# Patient Record
Sex: Female | Born: 1970 | Race: White | Hispanic: No | Marital: Single | Smoking: Current every day smoker
Health system: Southern US, Community
[De-identification: ages and names within clinical notes are randomized; demographics above are authoritative.]

## PROBLEM LIST (undated history)

## (undated) DIAGNOSIS — F329 Major depressive disorder, single episode, unspecified: Secondary | ICD-10-CM

## (undated) DIAGNOSIS — F32A Depression, unspecified: Secondary | ICD-10-CM

## (undated) DIAGNOSIS — M1990 Unspecified osteoarthritis, unspecified site: Secondary | ICD-10-CM

## (undated) DIAGNOSIS — G709 Myoneural disorder, unspecified: Secondary | ICD-10-CM

## (undated) DIAGNOSIS — F419 Anxiety disorder, unspecified: Secondary | ICD-10-CM

## (undated) DIAGNOSIS — K219 Gastro-esophageal reflux disease without esophagitis: Secondary | ICD-10-CM

## (undated) DIAGNOSIS — J45909 Unspecified asthma, uncomplicated: Secondary | ICD-10-CM

## (undated) DIAGNOSIS — I10 Essential (primary) hypertension: Secondary | ICD-10-CM

## (undated) HISTORY — PX: TONSILLECTOMY: SUR1361

## (undated) HISTORY — PX: OTHER SURGICAL HISTORY: SHX169

## (undated) HISTORY — PX: TYMPANOSTOMY TUBE PLACEMENT: SHX32

---

## 2003-10-19 ENCOUNTER — Ambulatory Visit (HOSPITAL_COMMUNITY): Admission: RE | Admit: 2003-10-19 | Discharge: 2003-10-19 | Payer: Self-pay | Admitting: *Deleted

## 2004-01-09 ENCOUNTER — Inpatient Hospital Stay (HOSPITAL_COMMUNITY): Admission: AD | Admit: 2004-01-09 | Discharge: 2004-01-13 | Payer: Self-pay | Admitting: Obstetrics and Gynecology

## 2004-01-09 ENCOUNTER — Ambulatory Visit: Payer: Self-pay | Admitting: *Deleted

## 2005-02-04 ENCOUNTER — Emergency Department (HOSPITAL_COMMUNITY): Admission: EM | Admit: 2005-02-04 | Discharge: 2005-02-04 | Payer: Self-pay | Admitting: Family Medicine

## 2007-02-23 ENCOUNTER — Emergency Department (HOSPITAL_COMMUNITY): Admission: EM | Admit: 2007-02-23 | Discharge: 2007-02-23 | Payer: Self-pay | Admitting: Emergency Medicine

## 2008-07-20 ENCOUNTER — Ambulatory Visit (HOSPITAL_COMMUNITY): Admission: RE | Admit: 2008-07-20 | Discharge: 2008-07-20 | Payer: Self-pay | Admitting: Family Medicine

## 2008-08-30 ENCOUNTER — Ambulatory Visit (HOSPITAL_COMMUNITY): Admission: RE | Admit: 2008-08-30 | Discharge: 2008-08-30 | Payer: Self-pay | Admitting: Obstetrics & Gynecology

## 2008-10-11 ENCOUNTER — Ambulatory Visit (HOSPITAL_COMMUNITY): Admission: RE | Admit: 2008-10-11 | Discharge: 2008-10-11 | Payer: Self-pay | Admitting: Obstetrics & Gynecology

## 2008-11-03 ENCOUNTER — Inpatient Hospital Stay (HOSPITAL_COMMUNITY): Admission: AD | Admit: 2008-11-03 | Discharge: 2008-11-03 | Payer: Self-pay | Admitting: Family Medicine

## 2008-12-27 ENCOUNTER — Ambulatory Visit (HOSPITAL_COMMUNITY): Admission: RE | Admit: 2008-12-27 | Discharge: 2008-12-27 | Payer: Self-pay | Admitting: Family Medicine

## 2009-01-01 ENCOUNTER — Ambulatory Visit: Payer: Self-pay | Admitting: Obstetrics and Gynecology

## 2009-01-01 ENCOUNTER — Inpatient Hospital Stay (HOSPITAL_COMMUNITY): Admission: AD | Admit: 2009-01-01 | Discharge: 2009-01-03 | Payer: Self-pay | Admitting: Obstetrics & Gynecology

## 2009-07-01 IMAGING — US US OB DETAIL+14 WK
1 series · 18 of 28 positions shown · non-contrast
Comparison: none

OBSTETRICAL ULTRASOUND:
 This ultrasound was performed in The [HOSPITAL], and the [facility] report will be stored to [REDACTED] PACS.

[Series 1: us ob detail+14 wk · 18 of 96 slices shown]
[im 1/96]
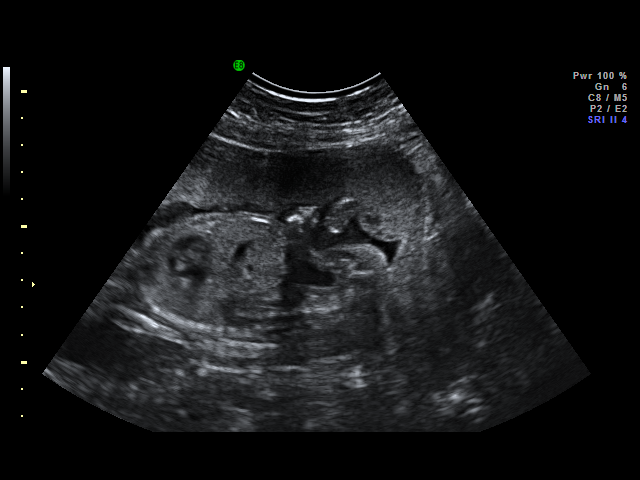
[im 8/96]
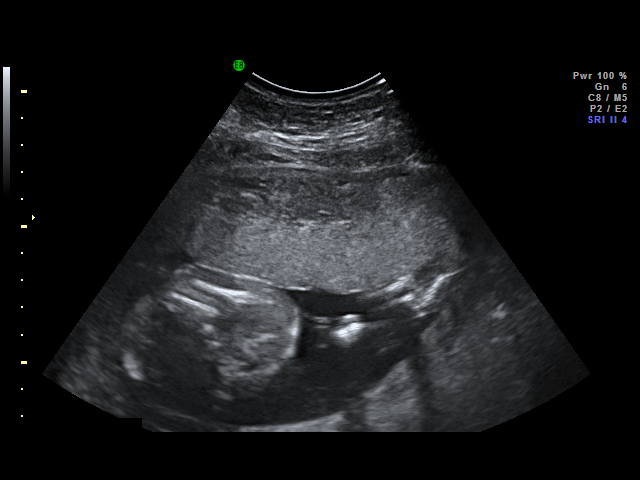
[im 11/96]
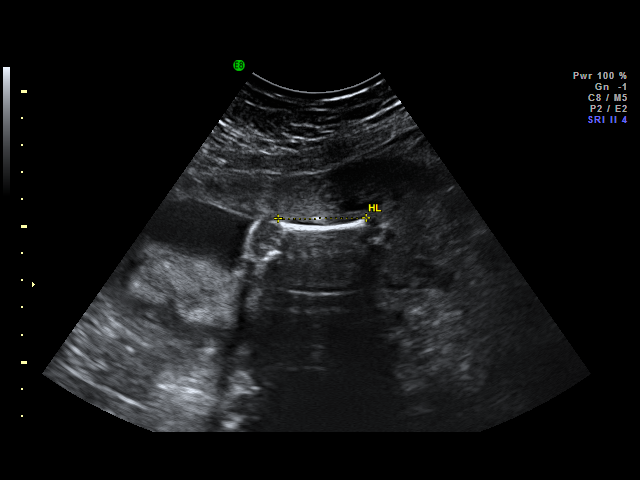
[im 18/96]
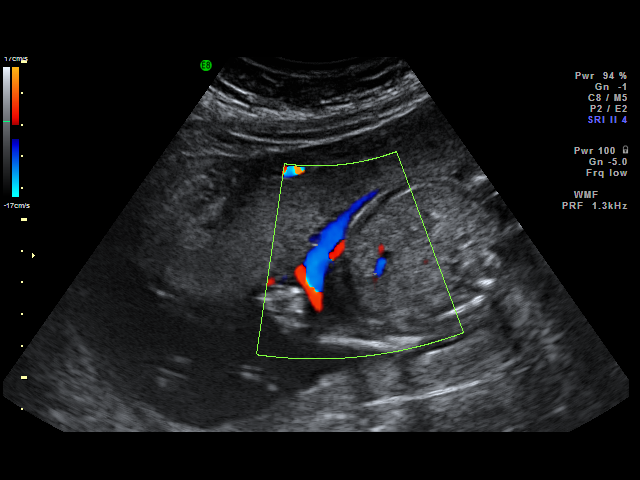
[im 25/96]
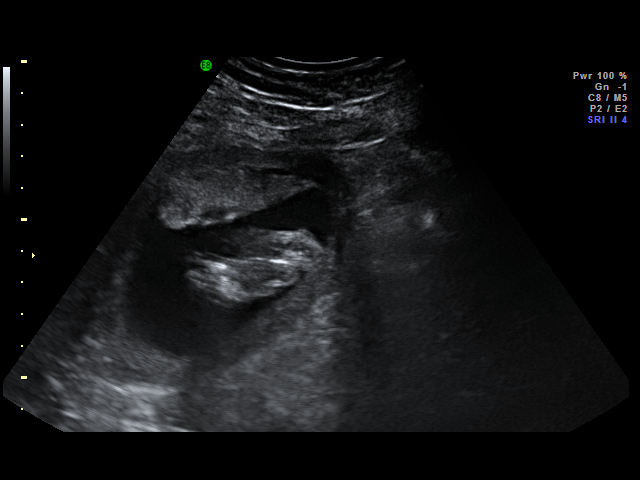
[im 29/96]
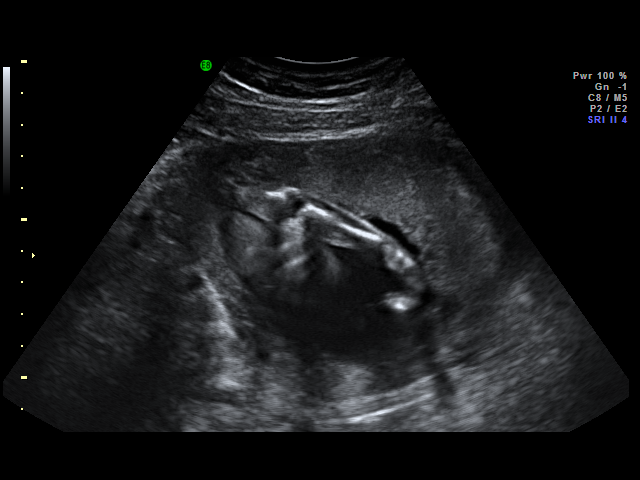
[im 36/96]
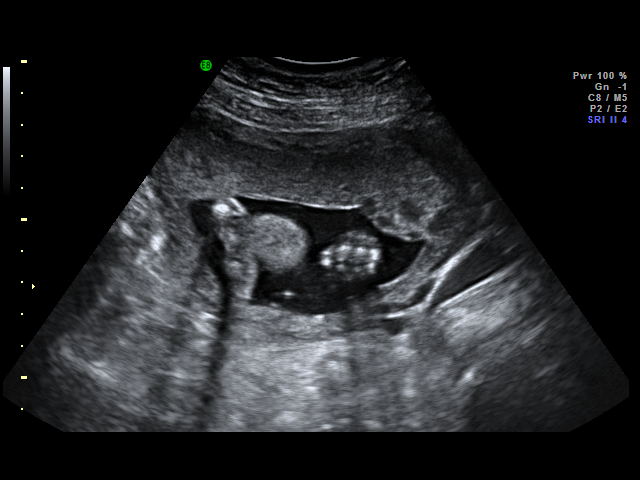
[im 39/96]
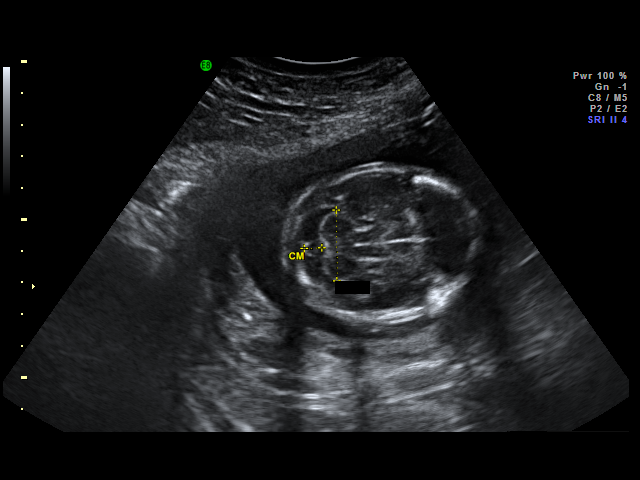
[im 46/96]
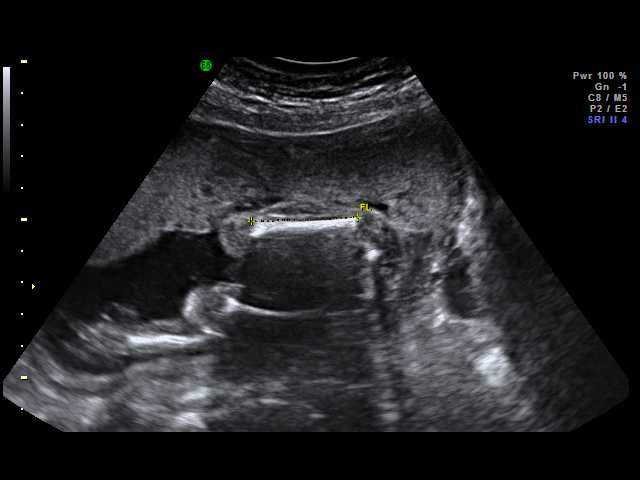
[im 50/96]
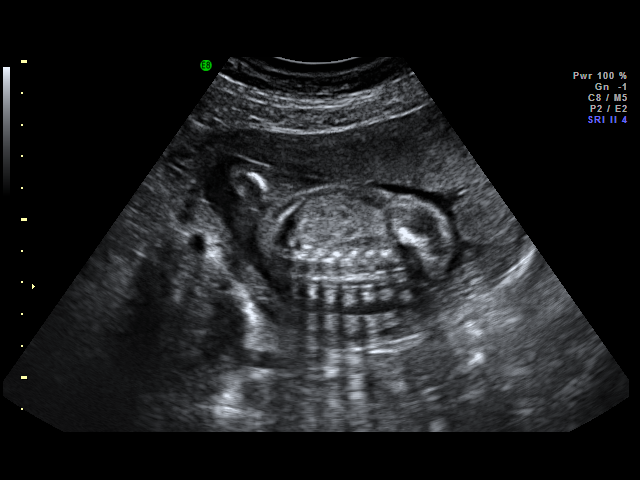
[im 57/96]
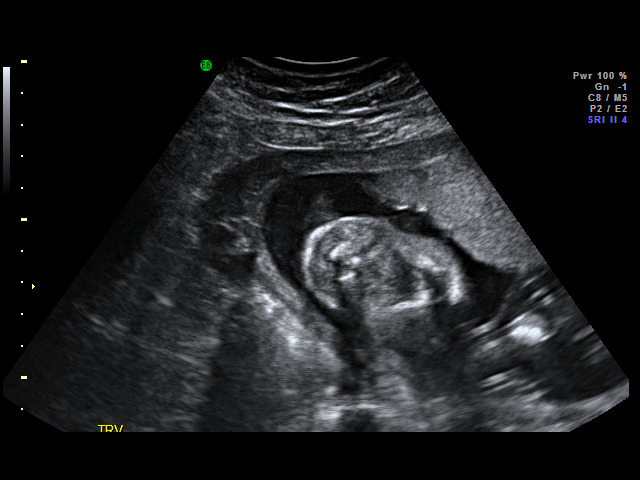
[im 60/96]
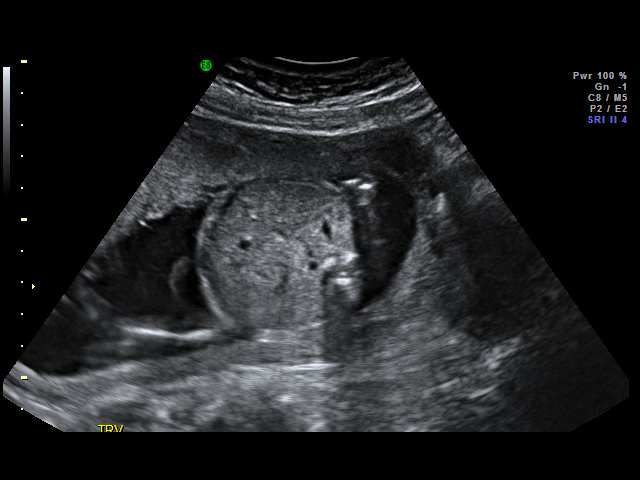
[im 67/96]
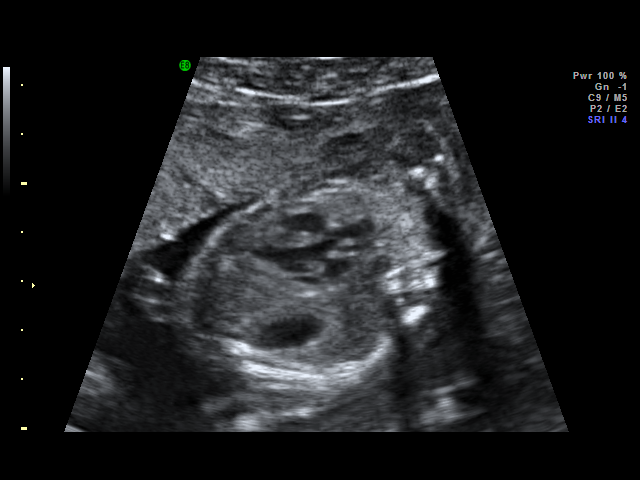
[im 74/96]
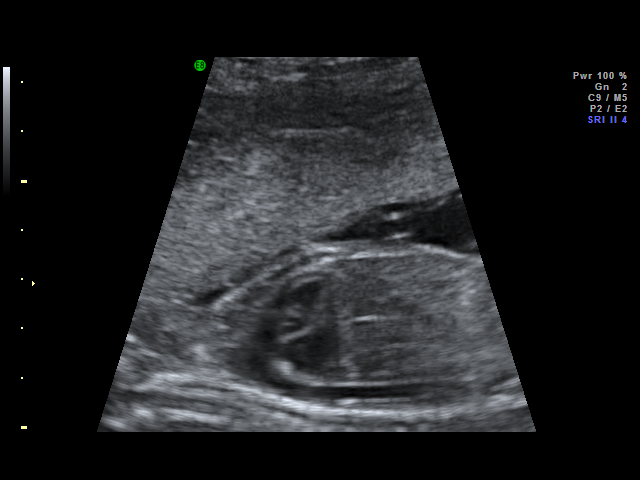
[im 78/96]
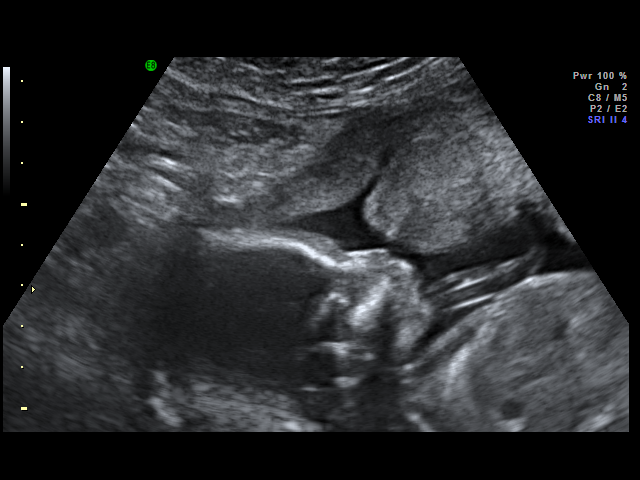
[im 85/96]
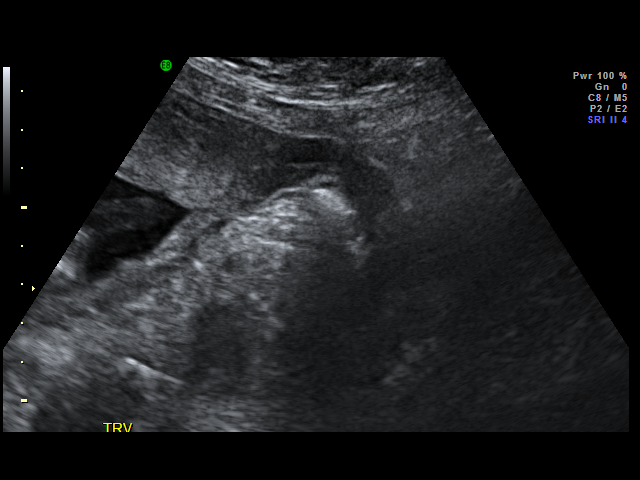
[im 88/96]
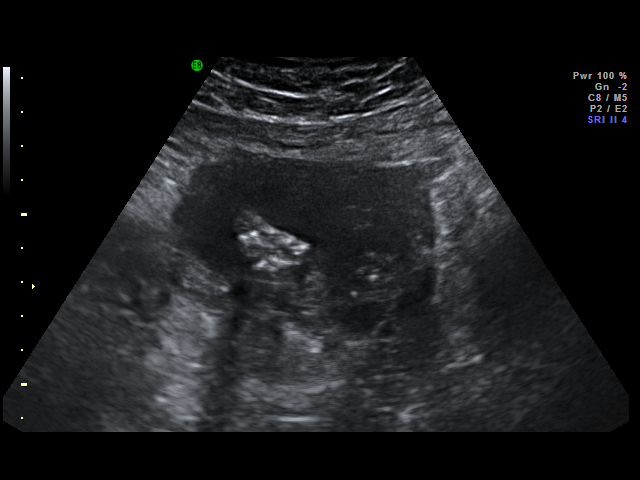
[im 96/96]
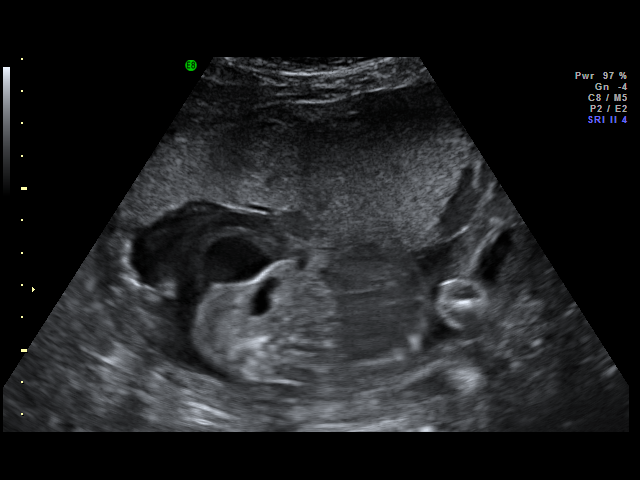

[18 of 28 positions shown; findings below may reference images not displayed]

IMPRESSION: [facility] has also been faxed to the ordering physician.

## 2009-11-05 ENCOUNTER — Emergency Department (HOSPITAL_COMMUNITY): Admission: EM | Admit: 2009-11-05 | Discharge: 2009-11-05 | Payer: Self-pay | Admitting: Emergency Medicine

## 2009-11-05 ENCOUNTER — Emergency Department (HOSPITAL_COMMUNITY): Admission: EM | Admit: 2009-11-05 | Discharge: 2009-11-05 | Payer: Self-pay | Admitting: Family Medicine

## 2010-03-25 ENCOUNTER — Encounter: Payer: Self-pay | Admitting: Internal Medicine

## 2010-03-26 ENCOUNTER — Encounter: Payer: Self-pay | Admitting: Internal Medicine

## 2010-03-26 ENCOUNTER — Encounter: Payer: Self-pay | Admitting: Obstetrics & Gynecology

## 2010-05-17 LAB — DIFFERENTIAL
Basophils Absolute: 0.1 10*3/uL (ref 0.0–0.1)
Basophils Relative: 0 % (ref 0–1)
Eosinophils Absolute: 0.1 10*3/uL (ref 0.0–0.7)
Eosinophils Relative: 1 % (ref 0–5)
Lymphocytes Relative: 18 % (ref 12–46)
Lymphs Abs: 3.2 10*3/uL (ref 0.7–4.0)
Monocytes Absolute: 0.8 10*3/uL (ref 0.1–1.0)
Monocytes Relative: 5 % (ref 3–12)
Neutro Abs: 13.9 10*3/uL — ABNORMAL HIGH (ref 1.7–7.7)
Neutrophils Relative %: 77 % (ref 43–77)

## 2010-05-17 LAB — CBC
HCT: 41.3 % (ref 36.0–46.0)
Hemoglobin: 14.2 g/dL (ref 12.0–15.0)
MCH: 31.8 pg (ref 26.0–34.0)
MCHC: 34.4 g/dL (ref 30.0–36.0)
MCV: 92.4 fL (ref 78.0–100.0)
Platelets: 288 10*3/uL (ref 150–400)
RBC: 4.47 MIL/uL (ref 3.87–5.11)
RDW: 12.8 % (ref 11.5–15.5)
WBC: 18.2 10*3/uL — ABNORMAL HIGH (ref 4.0–10.5)

## 2010-05-17 LAB — POCT I-STAT, CHEM 8
BUN: 9 mg/dL (ref 6–23)
Calcium, Ion: 1.1 mmol/L — ABNORMAL LOW (ref 1.12–1.32)
Chloride: 106 mEq/L (ref 96–112)
Creatinine, Ser: 1 mg/dL (ref 0.4–1.2)
Glucose, Bld: 97 mg/dL (ref 70–99)
HCT: 43 % (ref 36.0–46.0)
Hemoglobin: 14.6 g/dL (ref 12.0–15.0)
Potassium: 3.3 mEq/L — ABNORMAL LOW (ref 3.5–5.1)
Sodium: 140 mEq/L (ref 135–145)
TCO2: 23 mmol/L (ref 0–100)

## 2010-05-17 LAB — POCT PREGNANCY, URINE: Preg Test, Ur: NEGATIVE

## 2010-05-17 LAB — POCT URINALYSIS DIPSTICK
Glucose, UA: NEGATIVE mg/dL
Ketones, ur: NEGATIVE mg/dL
Nitrite: NEGATIVE
Protein, ur: 30 mg/dL — AB
Specific Gravity, Urine: <=1.005 (ref 1.005–1.030)
Urobilinogen, UA: 0.2 mg/dL (ref 0.0–1.0)
pH: 6 (ref 5.0–8.0)

## 2010-06-06 LAB — RH IMMUNE GLOB WKUP(>/=20WKS)(NOT WOMEN'S HOSP): Fetal Screen: NEGATIVE

## 2010-06-06 LAB — CBC
HCT: 33.5 % — ABNORMAL LOW (ref 36.0–46.0)
Hemoglobin: 11.5 g/dL — ABNORMAL LOW (ref 12.0–15.0)
MCHC: 34.2 g/dL (ref 30.0–36.0)
MCV: 97.3 fL (ref 78.0–100.0)
Platelets: 180 10*3/uL (ref 150–400)
RBC: 3.45 MIL/uL — ABNORMAL LOW (ref 3.87–5.11)
RDW: 14 % (ref 11.5–15.5)
WBC: 16.1 10*3/uL — ABNORMAL HIGH (ref 4.0–10.5)

## 2010-06-06 LAB — CCBB MATERNAL DONOR DRAW

## 2010-06-07 LAB — COMPREHENSIVE METABOLIC PANEL
ALT: 15 U/L (ref 0–35)
AST: 25 U/L (ref 0–37)
Albumin: 2.6 g/dL — ABNORMAL LOW (ref 3.5–5.2)
Alkaline Phosphatase: 115 U/L (ref 39–117)
BUN: 4 mg/dL — ABNORMAL LOW (ref 6–23)
CO2: 20 mEq/L (ref 19–32)
Calcium: 8.5 mg/dL (ref 8.4–10.5)
Chloride: 106 mEq/L (ref 96–112)
Creatinine, Ser: 0.36 mg/dL — ABNORMAL LOW (ref 0.4–1.2)
GFR calc Af Amer: >60 mL/min (ref >60)
GFR calc non Af Amer: >60 mL/min (ref >60)
Glucose, Bld: 98 mg/dL (ref 70–99)
Potassium: 3.3 mEq/L — ABNORMAL LOW (ref 3.5–5.1)
Sodium: 134 mEq/L — ABNORMAL LOW (ref 135–145)
Total Bilirubin: 0.5 mg/dL (ref 0.3–1.2)
Total Protein: 5.4 g/dL — ABNORMAL LOW (ref 6.0–8.3)

## 2010-06-07 LAB — CBC
HCT: 38 % (ref 36.0–46.0)
Hemoglobin: 13 g/dL (ref 12.0–15.0)
MCHC: 34.1 g/dL (ref 30.0–36.0)
MCV: 96.6 fL (ref 78.0–100.0)
Platelets: 220 10*3/uL (ref 150–400)
RBC: 3.94 MIL/uL (ref 3.87–5.11)
RDW: 14 % (ref 11.5–15.5)
WBC: 14.6 10*3/uL — ABNORMAL HIGH (ref 4.0–10.5)

## 2010-06-07 LAB — URIC ACID: Uric Acid, Serum: 5.8 mg/dL (ref 2.4–7.0)

## 2010-06-07 LAB — RPR: RPR Ser Ql: NONREACTIVE

## 2010-06-07 LAB — LACTATE DEHYDROGENASE: LDH: 119 U/L (ref 94–250)

## 2010-06-08 LAB — URINALYSIS, ROUTINE W REFLEX MICROSCOPIC
Bilirubin Urine: NEGATIVE
Glucose, UA: NEGATIVE mg/dL
Hgb urine dipstick: NEGATIVE
Ketones, ur: NEGATIVE mg/dL
Nitrite: NEGATIVE
Protein, ur: NEGATIVE mg/dL
Specific Gravity, Urine: 1.025 (ref 1.005–1.030)
Urobilinogen, UA: 0.2 mg/dL (ref 0.0–1.0)
pH: 6 (ref 5.0–8.0)

## 2010-09-06 IMAGING — CT CT ABD-PELV W/O CM
2 of 4 series · 17 of 46 positions shown, 19 images · non-contrast
Comparison: None

CLINICAL DATA: Left flank pain and hematuria.

CT ABDOMEN AND PELVIS WITHOUT CONTRAST
TECHNIQUE: Multidetector CT imaging of the abdomen and pelvis was
performed following the standard protocol without intravenous
contrast.

[Series 2: a/p w/o 5.0 b31f st · axial · non-contrast · 0.71mm/px · z∈[-464,-64]mm · 14 of 88 slices shown, 16 images]
[im 4/88  soft-tissue]
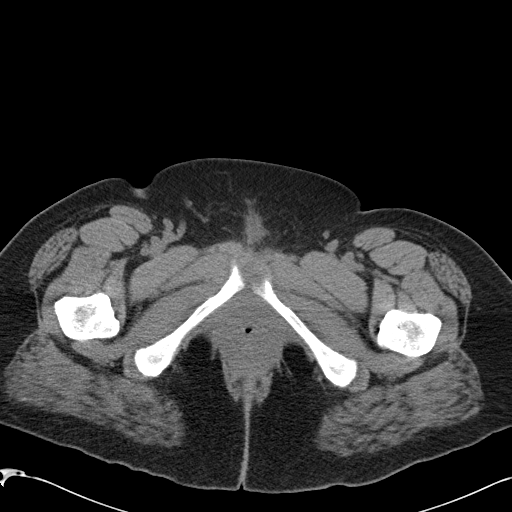
[im 4/88  bone]
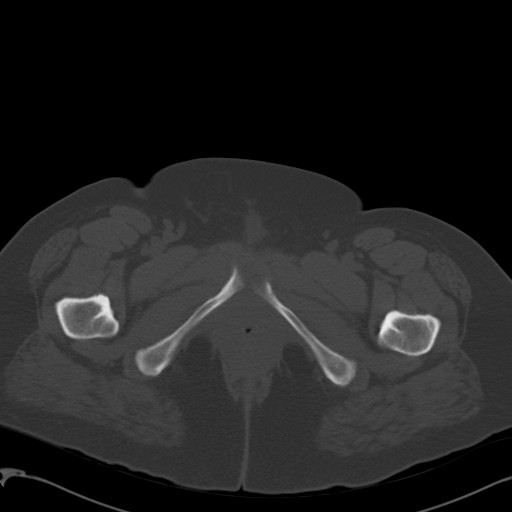
[im 12/88  soft-tissue]
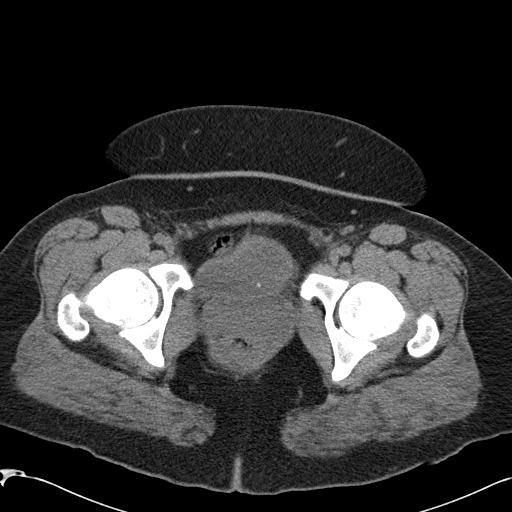
[im 16/88  soft-tissue]
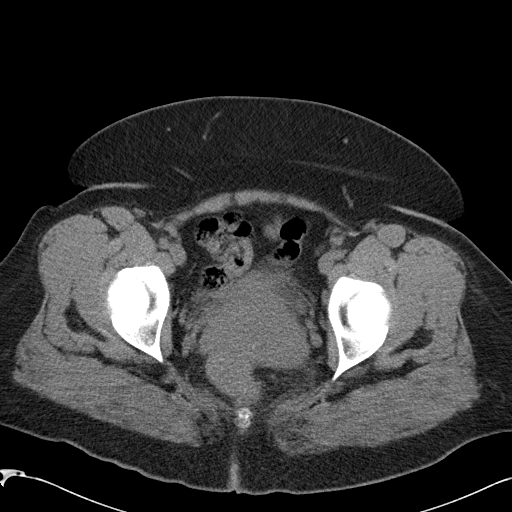
[im 23/88  soft-tissue]
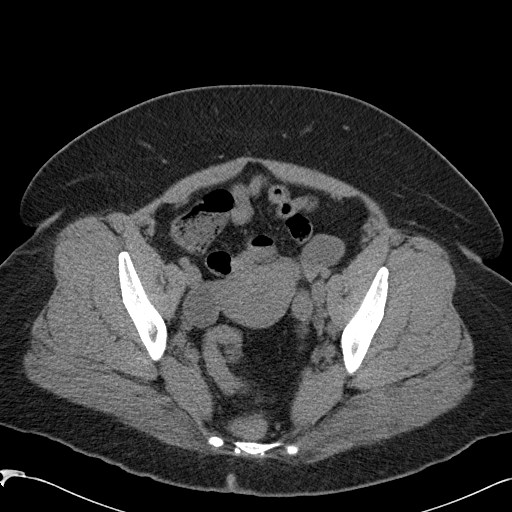
[im 31/88  soft-tissue]
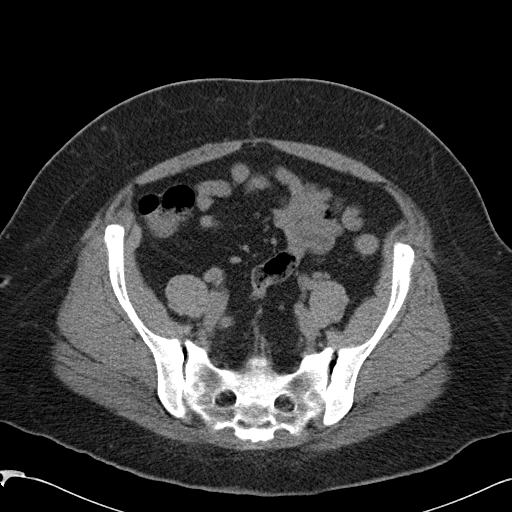
[im 35/88  soft-tissue]
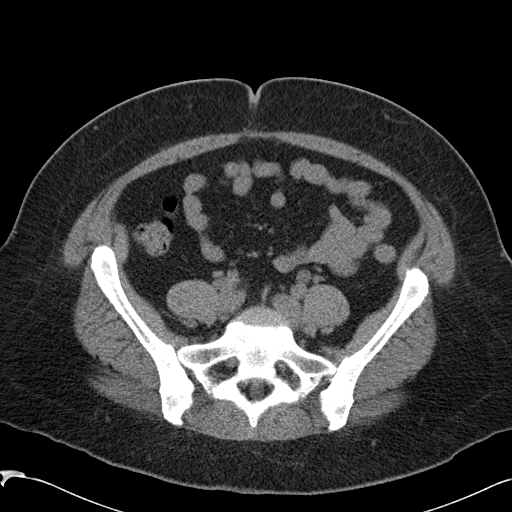
[im 42/88  soft-tissue]
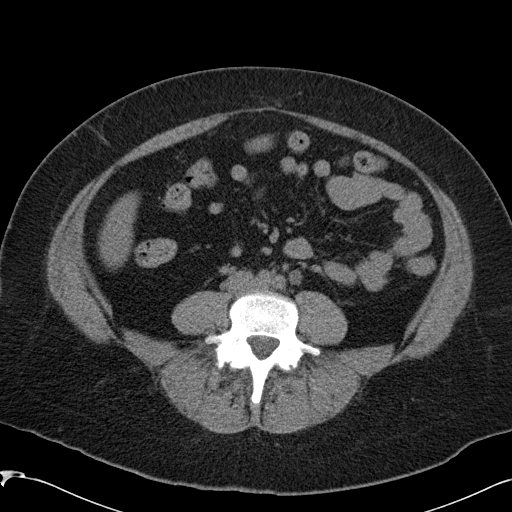
[im 46/88  soft-tissue]
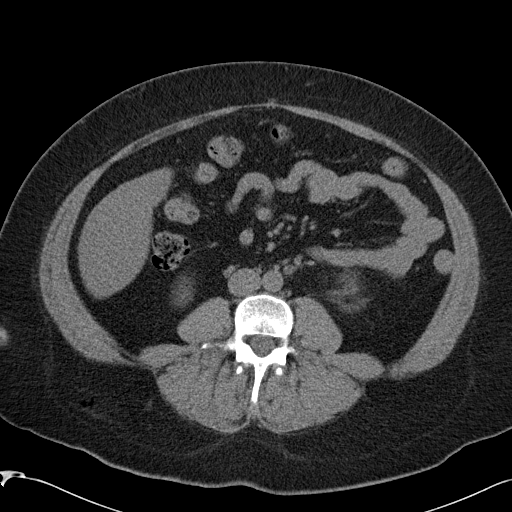
[im 53/88  soft-tissue]
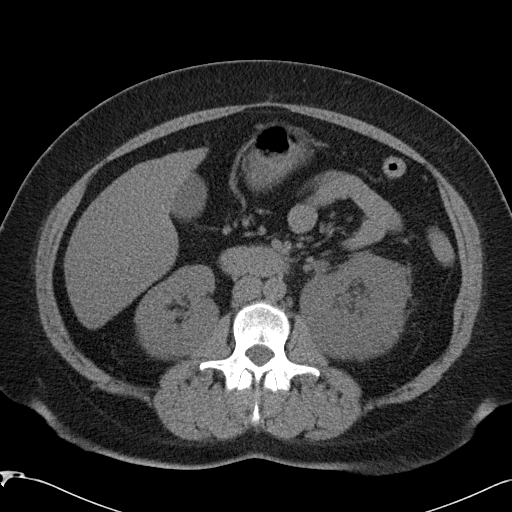
[im 53/88  bone]
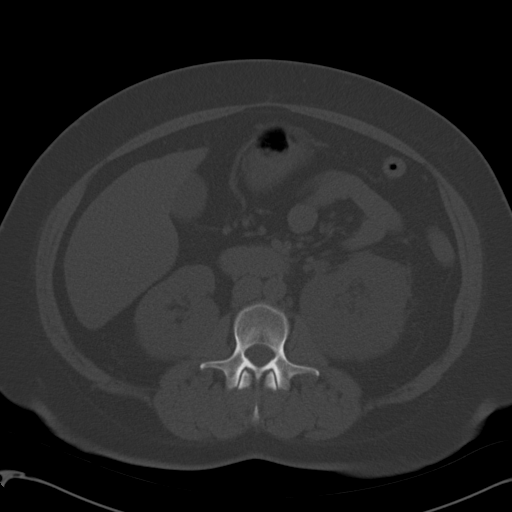
[im 57/88  soft-tissue]
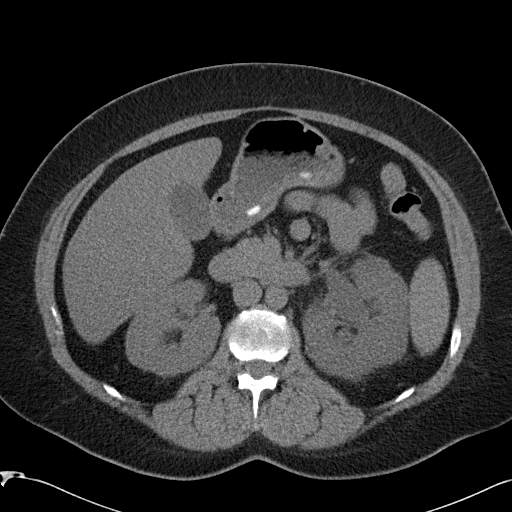
[im 65/88  soft-tissue]
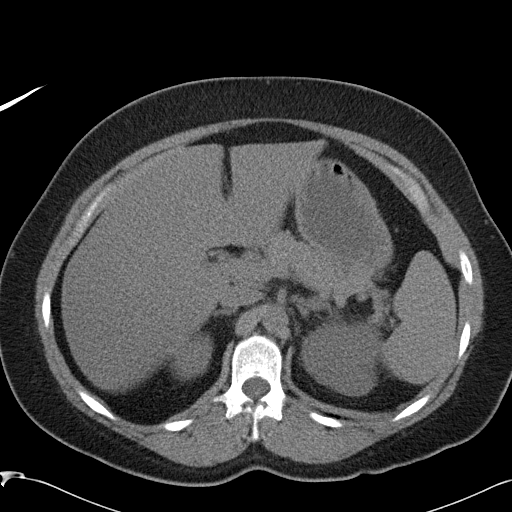
[im 72/88  soft-tissue]
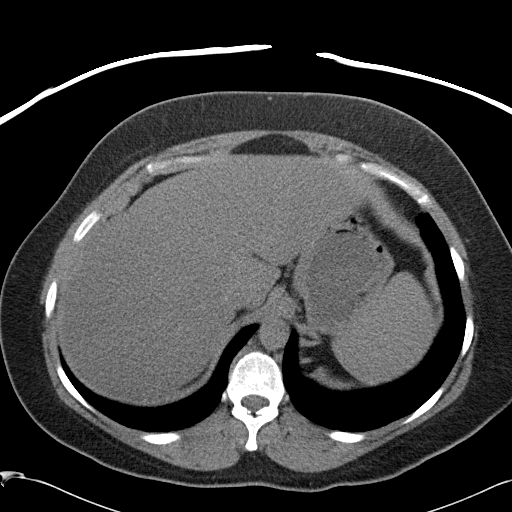
[im 76/88  soft-tissue]
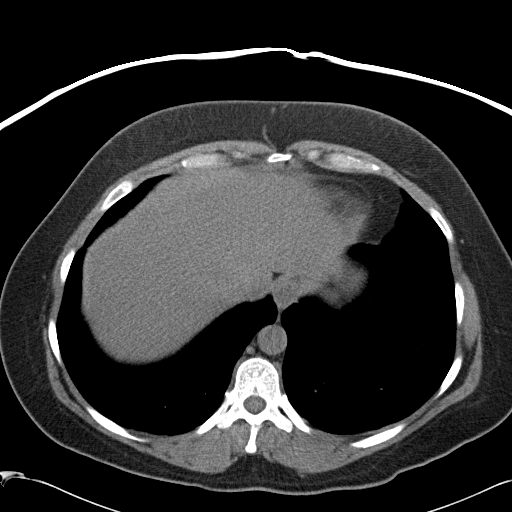
[im 84/88  soft-tissue]
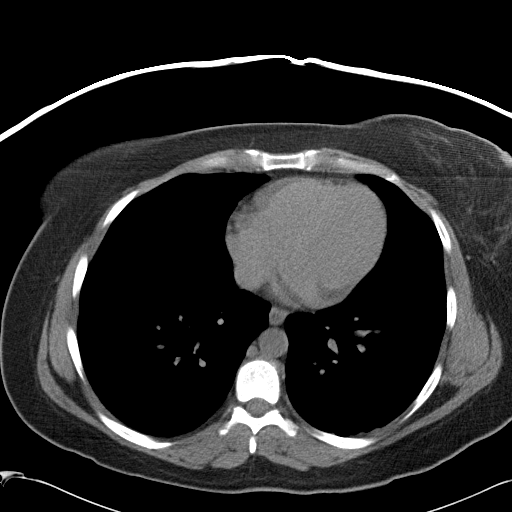

[Series 602: coronal · coronal · 0.86mm/px · 3 of 146 slices shown]
[im 49/146  soft-tissue]
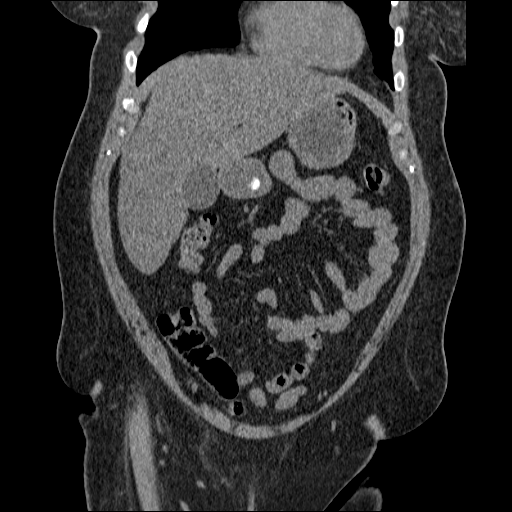
[im 65/146  soft-tissue]
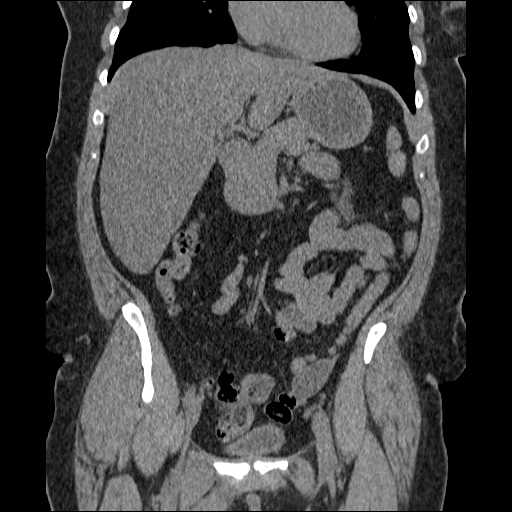
[im 81/146  soft-tissue]
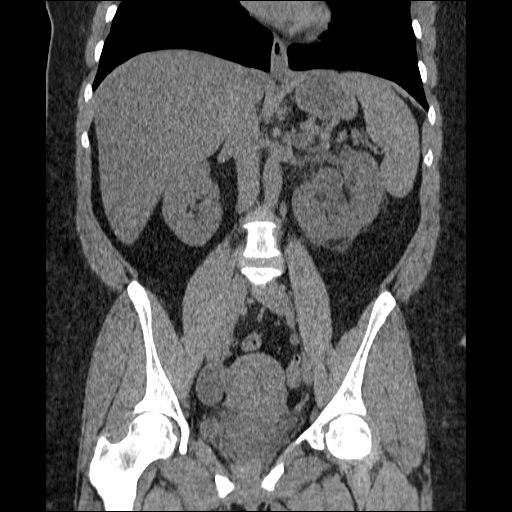

[17 of 46 positions shown; findings below may reference images not displayed]

FINDINGS: The lung bases are clear.

The unenhanced appearance of the liver and spleen are unremarkable.
The pancreas appears normal.  The adrenal glands and right kidney
are normal.  The left kidney demonstrates hydronephrosis and
perinephric fluid and interstitial changes consistent with acute
obstruction.  This also a dilated left ureter all way into the
pelvis.  There is a 2.5 mm calculus at the left ureteral vesicle
junction causing obstruction.

The aorta is normal in caliber.  No mesenteric or retroperitoneal
masses or adenopathy.  The gallbladder appears normal.

The uterus and ovaries are unremarkable.  The stomach, duodenum,
small bowel and colon are unremarkable.
IMPRESSION: 2.5 mm left UVJ calculus causing moderate to high-grade obstruction
of the left kidney and ureter.

## 2011-06-24 ENCOUNTER — Encounter (HOSPITAL_COMMUNITY): Payer: Self-pay | Admitting: *Deleted

## 2011-06-24 ENCOUNTER — Emergency Department (HOSPITAL_COMMUNITY)
Admission: EM | Admit: 2011-06-24 | Discharge: 2011-06-24 | Disposition: A | Discharge status: Home / Self Care | Payer: Self-pay | Attending: Emergency Medicine | Admitting: Emergency Medicine

## 2011-06-24 ENCOUNTER — Emergency Department (HOSPITAL_COMMUNITY): Payer: Self-pay

## 2011-06-24 DIAGNOSIS — F17200 Nicotine dependence, unspecified, uncomplicated: Secondary | ICD-10-CM | POA: Insufficient documentation

## 2011-06-24 DIAGNOSIS — M549 Dorsalgia, unspecified: Secondary | ICD-10-CM

## 2011-06-24 DIAGNOSIS — W11XXXA Fall on and from ladder, initial encounter: Secondary | ICD-10-CM | POA: Insufficient documentation

## 2011-06-24 DIAGNOSIS — M545 Low back pain, unspecified: Secondary | ICD-10-CM | POA: Insufficient documentation

## 2011-06-24 DIAGNOSIS — W19XXXA Unspecified fall, initial encounter: Secondary | ICD-10-CM

## 2011-06-24 DIAGNOSIS — I10 Essential (primary) hypertension: Secondary | ICD-10-CM | POA: Insufficient documentation

## 2011-06-24 DIAGNOSIS — Y92009 Unspecified place in unspecified non-institutional (private) residence as the place of occurrence of the external cause: Secondary | ICD-10-CM | POA: Insufficient documentation

## 2011-06-24 DIAGNOSIS — R059 Cough, unspecified: Secondary | ICD-10-CM | POA: Insufficient documentation

## 2011-06-24 DIAGNOSIS — R05 Cough: Secondary | ICD-10-CM | POA: Insufficient documentation

## 2011-06-24 HISTORY — DX: Essential (primary) hypertension: I10

## 2011-06-24 MED ORDER — ONDANSETRON 4 MG PO TBDP: 8.0000 mg | ORAL_TABLET | Freq: Once | ORAL | Status: DC

## 2011-06-24 MED ORDER — AZITHROMYCIN 250 MG PO TABS: 250.0000 mg | ORAL_TABLET | Freq: Every day | ORAL | Status: AC

## 2011-06-24 MED ORDER — OXYCODONE HCL 5 MG PO TABS: 5.0000 mg | ORAL_TABLET | ORAL | Status: AC | PRN

## 2011-06-24 MED ORDER — HYDROMORPHONE HCL PF 2 MG/ML IJ SOLN
2.0000 mg | Freq: Once | INTRAMUSCULAR | Status: AC
  Administered 2011-06-24: 2 mg via INTRAMUSCULAR
  Filled 2011-06-24: qty 1

## 2011-06-24 MED ORDER — ONDANSETRON 4 MG PO TBDP
ORAL_TABLET | ORAL | Status: AC
  Administered 2011-06-24: 17:00:00
  Filled 2011-06-24: qty 2

## 2011-06-24 NOTE — ED Notes (Signed)
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 States she woke this am with extreme pain

## 2011-06-24 NOTE — ED Notes (Signed)
Pt. Was given A dilaudid injection which made her nauseated

## 2011-06-24 NOTE — ED Provider Notes (Signed)
History     CSN: [id number]  Arrival date & time 06/24/11  1440   First MD Initiated Contact with Patient 06/24/11 1545      Chief Complaint  Patient presents with  . Fall  . Back Pain    (Consider location/radiation/quality/duration/timing/severity/associated sxs/prior treatment) The history is provided by the patient.   patient reports she was working on a 4 foot ladder yesterday and she fell backwards landing on her back while cleaning windows.  She reports she's been able to get up and move around since then but awoke this morning with severe pain in her upper lower back.  She denies shortness of breath.  She has had some cough but denies fevers or chills.  She denies weakness of her upper lower extremities.  She has no neck pain at this time.  She denies abdominal pain.  Her pain is moderate to severe at this time.  Past Medical History  Diagnosis Date  . Hypertension     History reviewed. No pertinent past surgical history.  No family history on file.  History  Substance Use Topics  . Smoking status: Current Everyday Smoker -- 0.5 packs/day    Types: Cigarettes  . Smokeless tobacco: Not on file  . Alcohol Use: No    OB History    Grav Para Term Preterm Abortions TAB SAB Ect Mult Living                  Review of Systems  All other systems reviewed and are negative.    Allergies  Ibuprofen and Tylenol  Home Medications   Current Outpatient Rx  Name Route Sig Dispense Refill  . OXYCODONE HCL 5 MG PO TABS Oral Take 1 tablet (5 mg total) by mouth every 4 (four) hours as needed for pain. 15 tablet 0    LMP 05/17/2011  Physical Exam  Nursing note and vitals reviewed. Constitutional: She is oriented to person, place, and time. She appears well-developed and well-nourished. No distress.  HENT:  Head: Normocephalic and atraumatic.  Eyes: EOM are normal.  Neck: Normal range of motion.       No cervical or paracervical spinal tenderness.  Cardiovascular:  Normal rate, regular rhythm and normal heart sounds.   Pulmonary/Chest: Effort normal and breath sounds normal.  Abdominal: Soft. She exhibits no distension. There is no tenderness.  Musculoskeletal: Normal range of motion.       Patient with 5 out of 5 strength in major muscle groups of bilateral upper lower extremities.  Patient with thoracic and lumbar spinal and paraspinal tenderness.  No step-offs noted.  Neurological: She is alert and oriented to person, place, and time.  Skin: Skin is warm and dry.  Psychiatric: She has a normal mood and affect. Judgment normal.    ED Course  Procedures (including critical care time)  Labs Reviewed - No data to display Dg Chest 2 View  06/24/2011  *RADIOLOGY REPORT*  Clinical Data: Cough.  Recent fall.  Pain.  CHEST - 2 VIEW  Comparison: None.  Findings: Heart size is normal.  Mediastinal shadows are normal.  I think there is patchy perihilar pulmonary density.  Findings could represent bronchitis and/or minimal patchy perihilar pneumonia.  No dense consolidation or collapse.  No effusions.  No acute bony finding.  IMPRESSION: Apparent bronchial thickening and patchy perihilar pulmonary density.  Findings suggest bronchitis and mild patchy pneumonia.  Original Report Authenticated By: Thomasenia Sales, M.D.   Dg Thoracic Spine 2 View  06/24/2011  *RADIOLOGY REPORT*  Clinical Data: Fall.  Pain.  THORACIC SPINE - 2 VIEW  Comparison: None.  Findings: Alignment is normal.  There are minor degenerative changes with a few levels of mild disc space narrowing with small osteophytes.  There is no evidence of fracture.  Posteromedial ribs appear negative.  IMPRESSION: Mild degenerative changes.  No acute or traumatic finding.  Original Report Authenticated By: Thomasenia Sales, M.D.   Dg Lumbar Spine Complete  06/24/2011  *RADIOLOGY REPORT*  Clinical Data: Back pain after fall.  LUMBAR SPINE - COMPLETE 4+ VIEW  Comparison: 11/05/2009  Findings: Five lumbar-type  vertebral bodies in normal alignment. No evidence of fracture.  There is degenerative facet arthropathy at L5-S1.  There is degenerative disease of the sacroiliac joints. No other finding of note.  IMPRESSION: No acute/traumatic finding.  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 Sacroiliac osteoarthritis.  Original Report Authenticated By: Thomasenia Sales, M.D.   i personally reviewed the xray  1. Back pain   2. Fall       MDM  The patient's pain is better at this time.  The Zofran made her slightly nauseated.  Her abdominal exam is benign.  Chest x-ray thoracic and lumbar spine films are normal.  C-spine is cleared by Nexus criteria.  No neurologic deficit.  The patient be sent home with a prescription for pain medicine.  Chest x-ray reports possible atypical pneumonia.  Patient is a smoker has had some cough.  I will prescribe her a prescription for azithromycin      Lyanne Co, MD 06/24/11 [phone]

## 2011-06-24 NOTE — Discharge Instructions (Signed)
Back Pain, Adult Low back pain is very common. About 1 in 5 people have back pain.The cause of low back pain is rarely dangerous. The pain often gets better over time.About half of people with a sudden onset of back pain feel better in just 2 weeks. About 8 in 10 people feel better by 6 weeks.  CAUSES Some common causes of back pain include:  Strain of the muscles or ligaments supporting the spine.   Wear and tear (degeneration) of the spinal discs.   Arthritis.   Direct injury to the back.  DIAGNOSIS Most of the time, the direct cause of low back pain is not known.However, back pain can be treated effectively even when the exact cause of the pain is unknown.Answering your caregiver's questions about your overall health and symptoms is one of the most accurate ways to make sure the cause of your pain is not dangerous. If your caregiver needs more information, he or she may order lab work or imaging tests (X-rays or MRIs).However, even if imaging tests show changes in your back, this usually does not require surgery. HOME CARE INSTRUCTIONS For many people, back pain returns.Since low back pain is rarely dangerous, it is often a condition that people can learn to manageon their own.   Remain active. It is stressful on the back to sit or stand in one place. Do not sit, drive, or stand in one place for more than 30 minutes at a time. Take short walks on level surfaces as soon as pain allows.Try to increase the length of time you walk each day.   Do not stay in bed.Resting more than 1 or 2 days can delay your recovery.   Do not avoid exercise or work.Your body is made to move.It is not dangerous to be active, even though your back may hurt.Your back will likely heal faster if you return to being active before your pain is gone.   Pay attention to your body when you bend and lift. Many people have less discomfortwhen lifting if they bend their knees, keep the load close to their  bodies,and avoid twisting. Often, the most comfortable positions are those that put less stress on your recovering back.   Find a comfortable position to sleep. Use a firm mattress and lie on your side with your knees slightly bent. If you lie on your back, put a pillow under your knees.   Only take over-the-counter or prescription medicines as directed by your caregiver. Over-the-counter medicines to reduce pain and inflammation are often the most helpful.Your caregiver may prescribe muscle relaxant drugs.These medicines help dull your pain so you can more quickly return to your normal activities and healthy exercise.   Put ice on the injured area.   Put ice in a plastic bag.   Place a towel between your skin and the bag.   Leave the ice on for 15 to 20 minutes, 3 to 4 times a day for the first 2 to 3 days. After that, ice and heat may be alternated to reduce pain and spasms.   Ask your caregiver about trying back exercises and gentle massage. This may be of some benefit.   Avoid feeling anxious or stressed.Stress increases muscle tension and can worsen back pain.It is important to recognize when you are anxious or stressed and learn ways to manage it.Exercise is a great option.  SEEK MEDICAL CARE IF:  You have pain that is not relieved with rest or medicine.   You have   pain that does not improve in 1 week.   You have new symptoms.   You are generally not feeling well.  SEEK IMMEDIATE MEDICAL CARE IF:   You have pain that radiates from your back into your legs.   You develop new bowel or bladder control problems.   You have unusual weakness or numbness in your arms or legs.   You develop nausea or vomiting.   You develop abdominal pain.   You feel faint.  Document Released: 02/18/2005 Document Revised: 02/07/2011 Document Reviewed: 07/09/2010 [employer] Patient Information 2012 [employer]. 

## 2014-05-29 ENCOUNTER — Emergency Department (INDEPENDENT_AMBULATORY_CARE_PROVIDER_SITE_OTHER)
Admission: EM | Admit: 2014-05-29 | Discharge: 2014-05-29 | Disposition: A | Discharge status: Home / Self Care | Payer: Self-pay | Source: Home / Self Care | Attending: Family Medicine | Admitting: Family Medicine

## 2014-05-29 ENCOUNTER — Encounter (HOSPITAL_COMMUNITY): Payer: Self-pay | Admitting: Emergency Medicine

## 2014-05-29 DIAGNOSIS — L02419 Cutaneous abscess of limb, unspecified: Secondary | ICD-10-CM

## 2014-05-29 MED ORDER — SULFAMETHOXAZOLE-TRIMETHOPRIM 800-160 MG PO TABS: 1.0000 | ORAL_TABLET | Freq: Two times a day (BID) | ORAL | Status: DC

## 2014-05-29 NOTE — ED Notes (Signed)
C/o vaginal abscess/boil. On set a couple days ago and has gradually gotten worse.  Pain and swelling.  Denies drainage.

## 2014-05-29 NOTE — Discharge Instructions (Signed)

## 2014-05-29 NOTE — ED Provider Notes (Signed)
CSN: [id number]     Arrival date & time 05/29/14  1623 History   First MD Initiated Contact with Patient 05/29/14 1715     Chief Complaint  Patient presents with  . Abscess   (Consider location/radiation/quality/duration/timing/severity/associated sxs/prior Treatment) HPI         44 year old female presents complaining of severe pain in the right upper leg/vaginal area. This started a few days ago and has gotten gradually worse. The pain, swelling, and pressure is now severe. She has been treating with warm compresses without much relief. She denies any systemic symptoms  Past Medical History  Diagnosis Date  . Hypertension    History reviewed. No pertinent past surgical history. History reviewed. No pertinent family history. History  Substance Use Topics  . Smoking status: Current Every Day Smoker -- 0.50 packs/day    Types: Cigarettes  . Smokeless tobacco: Not on file  . Alcohol Use: No   OB History    No data available     Review of Systems  Genitourinary:       See HPI  Skin:       Area of pain and swelling, see HPI  All other systems reviewed and are negative.   Allergies  Ibuprofen and Tylenol  Home Medications   Prior to Admission medications   Medication Sig Start Date End Date Taking? Authorizing Provider  sulfamethoxazole-trimethoprim (SEPTRA DS) 800-160 MG per tablet Take 1 tablet by mouth every 12 (twelve) hours. 05/29/14   Adrian Blackwater Topher Buenaventura, PA-C   BP 127/87 mmHg  Pulse 110  Temp(Src) 99.2 F (37.3 C) (Oral)  Resp 16  SpO2 100%  LMP 05/22/2014 Physical Exam  Constitutional: She is oriented to person, place, and time. Vital signs are normal. She appears well-developed and well-nourished. No distress.  HENT:  Head: Normocephalic and atraumatic.  Pulmonary/Chest: Effort normal. No respiratory distress.  Genitourinary:     Neurological: She is alert and oriented to person, place, and time. She has normal strength. Coordination normal.  Skin: Skin is  warm and dry. No rash noted. She is not diaphoretic.  Psychiatric: She has a normal mood and affect. Judgment normal.  Nursing note and vitals reviewed.   ED Course  INCISION AND DRAINAGE Date/Time: 05/29/2014 6:07 PM Performed by: Graylon Good Authorized by: Bradd Canary D Consent: Verbal consent obtained. Risks and benefits: risks, benefits and alternatives were discussed Consent given by: patient Patient identity confirmed: verbally with patient Time out: Immediately prior to procedure a "time out" was called to verify the correct patient, procedure, equipment, support staff and site/side marked as required. Type: abscess Body area: lower extremity Location details: right hip Anesthesia: local infiltration Local anesthetic: lidocaine 2% with epinephrine Anesthetic total: 5 ml Scalpel size: 11 Incision type: single straight Complexity: complex Drainage: purulent and  bloody Drainage amount: copious Packing material: 1/4 in iodoform gauze Patient tolerance: Patient tolerated the procedure well with no immediate complications   (including critical care time) Labs Review Labs Reviewed  CULTURE, ROUTINE-ABSCESS    Imaging Review No results found.   MDM   1. Abscess of leg    Abscess, incised and drained. Quarter inch iodoform gauze packing. Follow-up in 3-4 days for packing removal and wound check, at that time likely no more packing, just sits baths. She is allergic to narcotics, ibuprofen, and Tylenol so no medications for pain    Meds ordered this encounter  Medications  . sulfamethoxazole-trimethoprim (SEPTRA DS) 800-160 MG per tablet    Sig: Take  1 tablet by mouth every 12 (twelve) hours.    Dispense:  20 tablet    Refill:  0       Graylon GoodZachary H Laresha Bacorn, PA-C 05/29/14 1809

## 2014-06-02 LAB — CULTURE, ROUTINE-ABSCESS: SPECIAL REQUESTS: NORMAL

## 2014-09-07 ENCOUNTER — Emergency Department (HOSPITAL_COMMUNITY): Payer: Medicaid Other

## 2014-09-07 ENCOUNTER — Emergency Department (HOSPITAL_COMMUNITY)
Admission: EM | Admit: 2014-09-07 | Discharge: 2014-09-07 | Disposition: A | Discharge status: Home / Self Care | Payer: Self-pay | Attending: Emergency Medicine | Admitting: Emergency Medicine

## 2014-09-07 ENCOUNTER — Encounter (HOSPITAL_COMMUNITY): Payer: Self-pay | Admitting: *Deleted

## 2014-09-07 DIAGNOSIS — Y9289 Other specified places as the place of occurrence of the external cause: Secondary | ICD-10-CM | POA: Insufficient documentation

## 2014-09-07 DIAGNOSIS — W010XXA Fall on same level from slipping, tripping and stumbling without subsequent striking against object, initial encounter: Secondary | ICD-10-CM | POA: Insufficient documentation

## 2014-09-07 DIAGNOSIS — S161XXA Strain of muscle, fascia and tendon at neck level, initial encounter: Secondary | ICD-10-CM | POA: Insufficient documentation

## 2014-09-07 DIAGNOSIS — I10 Essential (primary) hypertension: Secondary | ICD-10-CM | POA: Insufficient documentation

## 2014-09-07 DIAGNOSIS — Z720 Tobacco use: Secondary | ICD-10-CM | POA: Insufficient documentation

## 2014-09-07 DIAGNOSIS — W19XXXA Unspecified fall, initial encounter: Secondary | ICD-10-CM

## 2014-09-07 DIAGNOSIS — Y998 Other external cause status: Secondary | ICD-10-CM | POA: Insufficient documentation

## 2014-09-07 DIAGNOSIS — S39012A Strain of muscle, fascia and tendon of lower back, initial encounter: Secondary | ICD-10-CM | POA: Insufficient documentation

## 2014-09-07 DIAGNOSIS — Y9389 Activity, other specified: Secondary | ICD-10-CM | POA: Insufficient documentation

## 2014-09-07 MED ORDER — OXYCODONE-ACETAMINOPHEN 5-325 MG PO TABS: 1.0000 | ORAL_TABLET | Freq: Four times a day (QID) | ORAL | Status: DC | PRN

## 2014-09-07 MED ORDER — OXYCODONE-ACETAMINOPHEN 5-325 MG PO TABS
1.0000 | ORAL_TABLET | Freq: Once | ORAL | Status: AC
  Administered 2014-09-07: 1 via ORAL
  Filled 2014-09-07: qty 1

## 2014-09-07 MED ORDER — CYCLOBENZAPRINE HCL 10 MG PO TABS: 10.0000 mg | ORAL_TABLET | Freq: Two times a day (BID) | ORAL | Status: DC | PRN

## 2014-09-07 MED ORDER — CYCLOBENZAPRINE HCL 10 MG PO TABS
10.0000 mg | ORAL_TABLET | Freq: Once | ORAL | Status: AC
  Administered 2014-09-07: 10 mg via ORAL
  Filled 2014-09-07: qty 1

## 2014-09-07 NOTE — ED Notes (Addendum)
Pt states that she slipped outside yesterday and fell onto slates and fell on her bottom and back. States that this morning she awoke with back pain and a stiff neck. States that she took ibuprofen and tylenol with no relief. Denies hitting her head/LOC.

## 2014-09-07 NOTE — ED Notes (Signed)
Pt verbalizes understanding of d/c instructions and denies any further needs at this time. 

## 2014-09-07 NOTE — ED Provider Notes (Signed)
CSN: [id number]     Arrival date & time 09/07/14  1638 History   This chart was scribed for non-physician practitioner, [facility] M. Damian Leavell, NP, working with Benjiman Core, MD, by Budd Palmer ED Scribe. This patient was seen in room TR06C/TR06C and the patient's care was started at 4:56 PM    Chief Complaint  Patient presents with  . Fall   Patient is a 44 y.o. female presenting with fall. The history is provided by the patient. No language interpreter was used.  Fall This is a new problem. The current episode started yesterday. The problem has been gradually worsening. Nothing relieves the symptoms. She has tried acetaminophen and a warm compress for the symptoms.   HPI Comments: Rachel Knapp is a 44 y.o. female who presents to the Emergency Department complaining of progressively worsening, aching, neck and back pain from a fall that occurred 1 day ago. She said she tripped over her flip flop on the last stone step in front of her door and fell backwards, landing on her butt, elbows, and back. She applied a heating pad immediately after and took tylenol and ibuprofen with no relief. This morning she felt as though she had been "hit by a truck." Pt states she is allergic to hydrocodone. She denies hitting her head.  Past Medical History  Diagnosis Date  . Hypertension    History reviewed. No pertinent past surgical history. No family history on file. History  Substance Use Topics  . Smoking status: Current Every Day Smoker -- 0.50 packs/day    Types: Cigarettes  . Smokeless tobacco: Not on file  . Alcohol Use: No   OB History    No data available     Review of Systems  Constitutional: Negative for fever.  Musculoskeletal: Positive for back pain and neck pain.  Skin: Negative for wound.  All other systems reviewed and are negative.   Allergies  Hydrocodone and Ibuprofen  Home Medications   Prior to Admission medications   Medication Sig Start Date End Date Taking?  Authorizing Provider  cyclobenzaprine (FLEXERIL) 10 MG tablet Take 1 tablet (10 mg total) by mouth 2 (two) times daily as needed for muscle spasms. 09/07/14   Hope Orlene Och, NP  oxyCODONE-acetaminophen (ROXICET) 5-325 MG per tablet Take 1 tablet by mouth every 6 (six) hours as needed for severe pain. 09/07/14   Hope Orlene Och, NP  sulfamethoxazole-trimethoprim (SEPTRA DS) 800-160 MG per tablet Take 1 tablet by mouth every 12 (twelve) hours. 05/29/14   Adrian Blackwater Baker, PA-C   BP 123/81 mmHg  Pulse 71  Temp(Src) 98.4 F (36.9 C) (Oral)  Resp 18  Ht  (1.651 m)  Wt 150 lb (68.04 kg)  BMI 24.96 kg/m2  SpO2 99%  LMP 09/02/2014 Physical Exam  Constitutional: She is oriented to person, place, and time. She appears well-developed and well-nourished. No distress.  HENT:  Head: Normocephalic and atraumatic.  Right Ear: Tympanic membrane normal.  Left Ear: Tympanic membrane normal.  Nose: Nose normal.  Mouth/Throat: Uvula is midline, oropharynx is clear and moist and mucous membranes are normal.  Eyes: Conjunctivae and EOM are normal. Pupils are equal, round, and reactive to light.  Neck: Normal range of motion. Neck supple. No tracheal deviation present.  Cardiovascular: Normal rate and regular rhythm.   Pulmonary/Chest: Effort normal and breath sounds normal. No respiratory distress. She has no wheezes. She has no rales.  Abdominal: Soft. Bowel sounds are normal. There is no tenderness.  Musculoskeletal:  Normal range of motion. She exhibits tenderness.       Cervical back: She exhibits tenderness. She exhibits normal range of motion and normal pulse.       Lumbar back: She exhibits tenderness, pain and spasm. She exhibits normal pulse.  Cervical spine tenderness  Neurological: She is alert and oriented to person, place, and time. She has normal strength. No cranial nerve deficit or sensory deficit. Gait normal.  Reflex Scores:      Bicep reflexes are 2+ on the right side and 2+ on the left  side.      Brachioradialis reflexes are 2+ on the right side and 2+ on the left side.      Patellar reflexes are 2+ on the right side and 2+ on the left side.      Achilles reflexes are 2+ on the right side and 2+ on the left side. Skin: Skin is warm and dry.  Psychiatric: She has a normal mood and affect. Her behavior is normal.  Nursing note and vitals reviewed.   ED Course  Procedures  DIAGNOSTIC STUDIES: Oxygen Saturation is 100% on RA, normal by my interpretation.    COORDINATION OF CARE: 4:58 PM - Discussed plans to order an x-ray, flexeril, and oxycodone-acetaminophen. Pt advised of plan for treatment and pt agrees.  Labs Review Labs Reviewed - No data to display  Imaging Review Dg Cervical Spine Complete  09/07/2014   CLINICAL DATA:  Acute onset of worsening neck pain, after fall backward. Initial encounter.  EXAM: CERVICAL SPINE  4+ VIEWS  COMPARISON:  None.  FINDINGS: There is no evidence of fracture or subluxation. Vertebral bodies demonstrate normal height and alignment. Intervertebral disc spaces are preserved. Prevertebral soft tissues are within normal limits. The provided odontoid view demonstrates no significant abnormality.  The visualized lung apices are clear.  IMPRESSION: No evidence of fracture or subluxation along the cervical spine.   Electronically Signed   By: Roanna RaiderJeffery  Chang M.D.   On: 09/07/2014 17:51   Dg Lumbar Spine Complete  09/07/2014   CLINICAL DATA:  Mild  EXAM: LUMBAR SPINE - COMPLETE 4+ VIEW  COMPARISON:  Radiograph dated 06/24/2011  FINDINGS: There is no evidence of lumbar spine fracture. Alignment is normal. Intervertebral disc spaces are maintained. There is stable degenerative changes and facet hypertrophy at L5-S1 vertebra.  IMPRESSION: No acute fracture or dislocation.   Electronically Signed   By: Elgie CollardArash  Radparvar M.D.   On: 09/07/2014 17:53     MDM  44 y.o. female with neck and low back pain s/p fall down steps last night. Stable for d/c without  focal neuro deficits. Discussed with the patient clinical and x-ray findings and plan of care. All questioned fully answered. [facility] or return her if any problems arise.   Final diagnoses:  Cervical strain, acute, initial encounter  Lumbar strain, initial encounter  Fall, initial encounter    I personally performed the services described in this documentation, which was scribed in my presence. The recorded information has been reviewed and is accurate.   [address]Hope GermaniaM Neese, NP 09/07/14 1847  Benjiman CoreNathan Pickering, MD 09/08/14 [phone]

## 2014-09-07 NOTE — ED Notes (Signed)
Pt returned from xray

## 2014-11-26 ENCOUNTER — Emergency Department (HOSPITAL_COMMUNITY)
Admission: EM | Admit: 2014-11-26 | Discharge: 2014-11-26 | Disposition: A | Discharge status: Home / Self Care | Payer: Medicaid Other | Attending: Emergency Medicine | Admitting: Emergency Medicine

## 2014-11-26 ENCOUNTER — Emergency Department (HOSPITAL_COMMUNITY): Payer: Medicaid Other

## 2014-11-26 ENCOUNTER — Encounter (HOSPITAL_COMMUNITY): Payer: Self-pay

## 2014-11-26 DIAGNOSIS — S60041A Contusion of right ring finger without damage to nail, initial encounter: Secondary | ICD-10-CM | POA: Insufficient documentation

## 2014-11-26 DIAGNOSIS — Z792 Long term (current) use of antibiotics: Secondary | ICD-10-CM | POA: Insufficient documentation

## 2014-11-26 DIAGNOSIS — Y998 Other external cause status: Secondary | ICD-10-CM | POA: Insufficient documentation

## 2014-11-26 DIAGNOSIS — S60051A Contusion of right little finger without damage to nail, initial encounter: Secondary | ICD-10-CM | POA: Insufficient documentation

## 2014-11-26 DIAGNOSIS — Z720 Tobacco use: Secondary | ICD-10-CM | POA: Insufficient documentation

## 2014-11-26 DIAGNOSIS — I10 Essential (primary) hypertension: Secondary | ICD-10-CM | POA: Insufficient documentation

## 2014-11-26 DIAGNOSIS — S60221A Contusion of right hand, initial encounter: Secondary | ICD-10-CM | POA: Insufficient documentation

## 2014-11-26 DIAGNOSIS — Y9389 Activity, other specified: Secondary | ICD-10-CM | POA: Insufficient documentation

## 2014-11-26 DIAGNOSIS — M79641 Pain in right hand: Secondary | ICD-10-CM

## 2014-11-26 DIAGNOSIS — S0093XA Contusion of unspecified part of head, initial encounter: Secondary | ICD-10-CM | POA: Insufficient documentation

## 2014-11-26 DIAGNOSIS — S060X0A Concussion without loss of consciousness, initial encounter: Secondary | ICD-10-CM

## 2014-11-26 DIAGNOSIS — Y9289 Other specified places as the place of occurrence of the external cause: Secondary | ICD-10-CM | POA: Insufficient documentation

## 2014-11-26 MED ORDER — OXYCODONE-ACETAMINOPHEN 5-325 MG PO TABS
1.0000 | ORAL_TABLET | Freq: Once | ORAL | Status: AC
  Administered 2014-11-26: 1 via ORAL
  Filled 2014-11-26: qty 1

## 2014-11-26 MED ORDER — ONDANSETRON 4 MG PO TBDP
8.0000 mg | ORAL_TABLET | Freq: Once | ORAL | Status: AC
Start: 2014-11-26 — End: 2014-11-26
  Administered 2014-11-26: 8 mg via ORAL
  Filled 2014-11-26: qty 2
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
## 2014-11-26 NOTE — ED Notes (Signed)
Patient left at this time with all belongings. 
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
## 2014-11-26 NOTE — Discharge Instructions (Signed)
Assault, General Assault includes any behavior, whether intentional or reckless, which results in bodily injury to another person and/or damage to property. Included in this would be any behavior, intentional or reckless, that by its nature would be understood (interpreted) by a reasonable person as intent to harm another person or to damage his/her property. Threats may be oral or written. They may be communicated through regular mail, computer, fax, or phone. These threats may be direct or implied. FORMS OF ASSAULT INCLUDE:  Physically assaulting a person. This includes physical threats to inflict physical harm as well as:  Slapping.  Hitting.  Poking.  Kicking.  Punching.  Pushing.  Arson.  Sabotage.  Equipment vandalism.  Damaging or destroying property.  Throwing or hitting objects.  Displaying a weapon or an object that appears to be a weapon in a threatening manner.  Carrying a firearm of any kind.  Using a weapon to harm someone.  Using greater physical size/strength to intimidate another.  Making intimidating or threatening gestures.  Bullying.  Hazing.  Intimidating, threatening, hostile, or abusive language directed toward another person.  It communicates the intention to engage in violence against that person. And it leads a reasonable person to expect that violent behavior may occur.  Stalking another person. IF IT HAPPENS AGAIN:  Immediately call for emergency help (911 in [place]  If someone poses clear and immediate danger to you, seek legal authorities to have a protective or restraining order put in place.  Less threatening assaults can at least be reported to authorities. STEPS TO TAKE IF A SEXUAL ASSAULT HAS HAPPENED  Go to an area of safety. This may include a shelter or staying with a friend. Stay away from the area where you have been attacked. A large percentage of sexual assaults are caused by a friend, relative or associate.  If  medications were given by your caregiver, take them as directed for the full length of time prescribed.  Only take over-the-counter or prescription medicines for pain, discomfort, or fever as directed by your caregiver.  If you have come in contact with a sexual disease, find out if you are to be tested again. If your caregiver is concerned about the HIV/AIDS virus, he/she may require you to have continued testing for several months.  For the protection of your privacy, test results can not be given over the phone. Make sure you receive the results of your test. If your test results are not back during your visit, make an appointment with your caregiver to find out the results. Do not assume everything is normal if you have not heard from your caregiver or the medical facility. It is important for you to follow up on all of your test results.  File appropriate papers with authorities. This is important in all assaults, even if it has occurred in a family or by a friend. SEEK MEDICAL CARE IF:  You have new problems because of your injuries.  You have problems that may be because of the medicine you are taking, such as:  Rash.  Itching.  Swelling.  Trouble breathing.  You develop belly (abdominal) pain, feel sick to your stomach (nausea) or are vomiting.  You begin to run a temperature.  You need supportive care or referral to a rape crisis center. These are centers with trained personnel who can help you get through this ordeal. SEEK IMMEDIATE MEDICAL CARE IF:  You are afraid of being threatened, beaten, or abused. In [place] call 911.  You  receive new injuries related to abuse.  You develop severe pain in any area injured in the assault or have any change in your condition that concerns you.  You faint or lose consciousness.  You develop chest pain or shortness of breath. Document Released: 02/18/2005 Document Revised: 05/13/2011 Document Reviewed: 10/07/2007 [facility] Patient  Information 2015 [place], [place]. This information is not intended to replace advice given to you by your health care provider. Make sure you discuss any questions you have with your health care provider. Concussion A concussion, or closed-head injury, is a brain injury caused by a direct blow to the head or by a quick and sudden movement (jolt) of the head or neck. Concussions are usually not life-threatening. Even so, the effects of a concussion can be serious. If you have had a concussion before, you are more likely to experience concussion-like symptoms after a direct blow to the head.  CAUSES  Direct blow to the head, such as from running into another player during a soccer game, being hit in a fight, or hitting your head on a hard surface.  A jolt of the head or neck that causes the brain to move back and forth inside the skull, such as in a car crash. SIGNS AND SYMPTOMS The signs of a concussion can be hard to notice. Early on, they may be missed by you, family members, and health care providers. You may look fine but act or feel differently. Symptoms are usually temporary, but they may last for days, weeks, or even longer. Some symptoms may appear right away while others may not show up for hours or days. Every head injury is different. Symptoms include:  Mild to moderate headaches that will not go away.  A feeling of pressure inside your head.  Having more trouble than usual:  Learning or remembering things you have heard.  Answering questions.  Paying attention or concentrating.  Organizing daily tasks.  Making decisions and solving problems.  Slowness in thinking, acting or reacting, speaking, or reading.  Getting lost or being easily confused.  Feeling tired all the time or lacking energy (fatigued).  Feeling drowsy.  Sleep disturbances.  Sleeping more than usual.  Sleeping less than usual.  Trouble falling asleep.  Trouble sleeping (insomnia).  Loss of  balance or feeling lightheaded or dizzy.  Nausea or vomiting.  Numbness or tingling.  Increased sensitivity to:  Sounds.  Lights.  Distractions.  Vision problems or eyes that tire easily.  Diminished sense of taste or smell.  Ringing in the ears.  Mood changes such as feeling sad or anxious.  Becoming easily irritated or angry for little or no reason.  Lack of motivation.  Seeing or hearing things other people do not see or hear (hallucinations). DIAGNOSIS Your health care provider can usually diagnose a concussion based on a description of your injury and symptoms. He or she will ask whether you passed out (lost consciousness) and whether you are having trouble remembering events that happened right before and during your injury. Your evaluation might include:  A brain scan to look for signs of injury to the brain. Even if the test shows no injury, you may still have a concussion.  Blood tests to be sure other problems are not present. TREATMENT  Concussions are usually treated in an emergency department, in urgent care, or at a clinic. You may need to stay in the hospital overnight for further treatment.  Tell your health care provider if you are taking any  medicines, including prescription medicines, over-the-counter medicines, and natural remedies. Some medicines, such as blood thinners (anticoagulants) and aspirin, may increase the chance of complications. Also tell your health care provider whether you have had alcohol or are taking illegal drugs. This information may affect treatment.  Your health care provider will send you home with important instructions to follow.  How fast you will recover from a concussion depends on many factors. These factors include how severe your concussion is, what part of your brain was injured, your age, and how healthy you were before the concussion.  Most people with mild injuries recover fully. Recovery can take time. In general,  recovery is slower in older persons. Also, persons who have had a concussion in the past or have other medical problems may find that it takes longer to recover from their current injury. HOME CARE INSTRUCTIONS General Instructions  Carefully follow the directions your health care provider gave you.  Only take over-the-counter or prescription medicines for pain, discomfort, or fever as directed by your health care provider.  Take only those medicines that your health care provider has approved.  Do not drink alcohol until your health care provider says you are well enough to do so. Alcohol and certain other drugs may slow your recovery and can put you at risk of further injury.  If it is harder than usual to remember things, write them down.  If you are easily distracted, try to do one thing at a time. For example, do not try to watch TV while fixing dinner.  Talk with family members or close friends when making important decisions.  Keep all follow-up appointments. Repeated evaluation of your symptoms is recommended for your recovery.  Watch your symptoms and tell others to do the same. Complications sometimes occur after a concussion. Older adults with a brain injury may have a higher risk of serious complications, such as a blood clot on the brain.  Tell your teachers, school nurse, school counselor, coach, athletic trainer, or work Production designer, theatre/television/film about your injury, symptoms, and restrictions. Tell them about what you can or cannot do. They should watch for:  Increased problems with attention or concentration.  Increased difficulty remembering or learning new information.  Increased time needed to complete tasks or assignments.  Increased irritability or decreased ability to cope with stress.  Increased symptoms.  Rest. Rest helps the brain to heal. Make sure you:  Get plenty of sleep at night. Avoid staying up late at night.  Keep the same bedtime hours on weekends and  weekdays.  Rest during the day. Take daytime naps or rest breaks when you feel tired.  Limit activities that require a lot of thought or concentration. These include:  Doing homework or job-related work.  Watching TV.  Working on the computer.  Avoid any situation where there is potential for another head injury (football, hockey, soccer, basketball, martial arts, downhill snow sports and horseback riding). Your condition will get worse every time you experience a concussion. You should avoid these activities until you are evaluated by the appropriate follow-up health care providers. Returning To Your Regular Activities You will need to return to your normal activities slowly, not all at once. You must give your body and brain enough time for recovery.  Do not return to sports or other athletic activities until your health care provider tells you it is safe to do so.  Ask your health care provider when you can drive, ride a bicycle, or operate heavy machinery.  Your ability to react may be slower after a brain injury. Never do these activities if you are dizzy.  Ask your health care provider about when you can return to work or school. Preventing Another Concussion It is very important to avoid another brain injury, especially before you have recovered. In rare cases, another injury can lead to permanent brain damage, brain swelling, or death. The risk of this is greatest during the first 7-10 days after a head injury. Avoid injuries by:  Wearing a seat belt when riding in a car.  Drinking alcohol only in moderation.  Wearing a helmet when biking, skiing, skateboarding, skating, or doing similar activities.  Avoiding activities that could lead to a second concussion, such as contact or recreational sports, until your health care provider says it is okay.  Taking safety measures in your home.  Remove clutter and tripping hazards from floors and stairways.  Use grab bars in bathrooms  and handrails by stairs.  Place non-slip mats on floors and in bathtubs.  Improve lighting in dim areas. SEEK MEDICAL CARE IF:  You have increased problems paying attention or concentrating.  You have increased difficulty remembering or learning new information.  You need more time to complete tasks or assignments than before.  You have increased irritability or decreased ability to cope with stress.  You have more symptoms than before. Seek medical care if you have any of the following symptoms for more than 2 weeks after your injury:  Lasting (chronic) headaches.  Dizziness or balance problems.  Nausea.  Vision problems.  Increased sensitivity to noise or light.  Depression or mood swings.  Anxiety or irritability.  Memory problems.  Difficulty concentrating or paying attention.  Sleep problems.  Feeling tired all the time. SEEK IMMEDIATE MEDICAL CARE IF:  You have severe or worsening headaches. These may be a sign of a blood clot in the brain.  You have weakness (even if only in one hand, leg, or part of the face).  You have numbness.  You have decreased coordination.  You vomit repeatedly.  You have increased sleepiness.  One pupil is larger than the other.  You have convulsions.  You have slurred speech.  You have increased confusion. This may be a sign of a blood clot in the brain.  You have increased restlessness, agitation, or irritability.  You are unable to recognize people or places.  You have neck pain.  It is difficult to wake you up.  You have unusual behavior changes.  You lose consciousness. MAKE SURE YOU:  Understand these instructions.  Will watch your condition.  Will get help right away if you are not doing well or get worse. Document Released: 05/11/2003 Document Revised: 02/23/2013 Document Reviewed: 09/10/2012 [facility] Patient Information 2015 [place], [place]. This information is not intended to replace advice  given to you by your health care provider. Make sure you discuss any questions you have with your health care provider. Intermetacarpal Sprain The intermetacarpal ligaments run between the knuckles at the base of the fingers. These ligaments are vulnerable to sprain and injury in which the ligament becomes overstretched or torn. Intermetacarpal sprains are classified into 3 categories. Grade 1 sprains cause pain, but the tendon is not lengthened. Grade 2 sprains include a lengthened ligament, due to the ligament being stretched or partially ruptured. With grade 2 sprains there is still function, although function may be decreased. Grade 3 sprains include a complete tear of the ligament, and the joint usually displays a  loss of function.  SYMPTOMS   Severe pain at the time of injury.  Often, a feeling of popping or tearing inside the hand.  Tenderness and inflammation at the knuckles.  Bruising within a couple days of injury.  Impaired ability to use the hand. CAUSES  This condition occurs when the intermetacarpal ligaments are subjected to a greater stress than they can handle. This causes the ligaments to become stretched or torn. RISK INCREASES WITH:  Previous hand injury.  Fighting sports (boxing, wrestling, martial arts).  Sports in which you could fall on an outstretched hand (soccer, basketball, volleyball).  Other sports with repeated hand trauma (water polo, gymnastics).  Poor hand strength and flexibility.  Inadequate or poorly fitted protective equipment. PREVENTION   Warm up and stretch properly before activity.  Maintain appropriate conditioning:  Hand flexibility.  Muscle strength and endurance.  Applying tape, protective strapping, or a brace may help prevent injury.  Provide the hand with support during sports and practice activities for 6 to 12 months following injury. PROGNOSIS  With proper treatment, healing should occur without impairment. The length of  healing varies from 2 to 12 weeks, depending on the severity of injury. RELATED COMPLICATIONS   Longer healing time, if activities are resumed too soon.  Recurring symptoms or repeated injury, resulting in a chronic problem.  Injury to other nearby structures (bone, cartilage, tendon).  Arthritis of the knuckle (intermetacarpal) joint, with repeated sprains.  Prolonged disability (sometimes).  Hand and finger stiffness or weakness. TREATMENT Treatment first involves ice and medicine to reduce pain and inflammation. An elastic compression bandage may be worn to reduce discomfort and to protect the area. Depending on the severity of injury, you may be required to restrain the area with a cast, splint, or brace. After the ligament has been allowed to heal, strengthening and stretching exercises may be needed to regain strength and a full range of motion. Exercises may be completed at home or with a therapist. Surgery is rarely needed. MEDICATION   If pain medicine is needed, nonsteroidal anti-inflammatory medicines (aspirin and ibuprofen), or other minor pain relievers (acetaminophen), are often advised.  Do not take pain medicine for 7 days before surgery.  Stronger pain relievers may be prescribed if your caregiver thinks they are needed. Use only as directed and only as much as you need. HEAT AND COLD  Cold treatment (icing) should be applied for 10 to 15 minutes every 2 to 3 hours for inflammation and pain, and immediately after activity that aggravates your symptoms. Use ice packs or an ice massage.  Heat treatment may be used before performing stretching and strengthening activities prescribed by your caregiver, physical therapist, or athletic trainer. Use a heat pack or a warm water soak. SEEK MEDICAL CARE IF:   Symptoms remain or get worse, despite treatment for longer than 2 to 4 weeks.  You experience pain, numbness, discoloration, or coldness in the hand or fingers.  You  develop blue, gray, or dark fingernails.  Any of the following occur after surgery: increased pain, swelling, redness, drainage of fluids, bleeding in the affected area, or signs of infection, including fever.  New, unexplained symptoms develop. (Drugs used in treatment may produce side effects.) Document Released: 02/18/2005 Document Revised: 07/05/2013 Document Reviewed: 06/02/2008 [facility] Patient Information 2015 [place], [place]. This information is not intended to replace advice given to you by your health care provider. Make sure you discuss any questions you have with your health care provider.

## 2017-10-31 ENCOUNTER — Emergency Department (HOSPITAL_COMMUNITY)
Admission: EM | Admit: 2017-10-31 | Discharge: 2017-10-31 | Disposition: A | Discharge status: Home / Self Care | Payer: Self-pay | Attending: Emergency Medicine | Admitting: Emergency Medicine

## 2017-10-31 ENCOUNTER — Emergency Department (HOSPITAL_COMMUNITY): Payer: Self-pay

## 2017-10-31 ENCOUNTER — Encounter (HOSPITAL_COMMUNITY): Payer: Self-pay | Admitting: Emergency Medicine

## 2017-10-31 ENCOUNTER — Emergency Department (HOSPITAL_COMMUNITY)
Admission: EM | Admit: 2017-10-31 | Discharge: 2017-11-01 | Disposition: A | Discharge status: Other Acute Inpatient Hospital | Payer: Self-pay | Attending: Emergency Medicine | Admitting: Emergency Medicine

## 2017-10-31 ENCOUNTER — Encounter (HOSPITAL_COMMUNITY): Payer: Self-pay | Admitting: *Deleted

## 2017-10-31 DIAGNOSIS — I10 Essential (primary) hypertension: Secondary | ICD-10-CM | POA: Insufficient documentation

## 2017-10-31 DIAGNOSIS — F332 Major depressive disorder, recurrent severe without psychotic features: Secondary | ICD-10-CM | POA: Insufficient documentation

## 2017-10-31 DIAGNOSIS — F17210 Nicotine dependence, cigarettes, uncomplicated: Secondary | ICD-10-CM | POA: Insufficient documentation

## 2017-10-31 DIAGNOSIS — R45851 Suicidal ideations: Secondary | ICD-10-CM | POA: Insufficient documentation

## 2017-10-31 DIAGNOSIS — Z79899 Other long term (current) drug therapy: Secondary | ICD-10-CM | POA: Insufficient documentation

## 2017-10-31 DIAGNOSIS — F419 Anxiety disorder, unspecified: Secondary | ICD-10-CM | POA: Insufficient documentation

## 2017-10-31 HISTORY — DX: Depression, unspecified: F32.A

## 2017-10-31 HISTORY — DX: Major depressive disorder, single episode, unspecified: F32.9

## 2017-10-31 LAB — COMPREHENSIVE METABOLIC PANEL
ALBUMIN: 3.5 g/dL (ref 3.5–5.0)
ALK PHOS: 73 U/L (ref 38–126)
ALT: 24 U/L (ref 0–44)
ALT: 25 U/L (ref 0–44)
AST: 23 U/L (ref 15–41)
AST: 24 U/L (ref 15–41)
Albumin: 3.5 g/dL (ref 3.5–5.0)
Alkaline Phosphatase: 71 U/L (ref 38–126)
Anion gap: 9 (ref 5–15)
Anion gap: 9 (ref 5–15)
BILIRUBIN TOTAL: 0.4 mg/dL (ref 0.3–1.2)
BUN: 5 mg/dL — ABNORMAL LOW (ref 6–20)
BUN: 5 mg/dL — ABNORMAL LOW (ref 6–20)
CALCIUM: 8.8 mg/dL — AB (ref 8.9–10.3)
CHLORIDE: 107 mmol/L (ref 98–111)
CO2: 26 mmol/L (ref 22–32)
CO2: 26 mmol/L (ref 22–32)
Calcium: 8.9 mg/dL (ref 8.9–10.3)
Chloride: 110 mmol/L (ref 98–111)
Creatinine, Ser: 0.81 mg/dL (ref 0.44–1.00)
Creatinine, Ser: 0.87 mg/dL (ref 0.44–1.00)
GFR calc Af Amer: >60 mL/min (ref >60)
GFR calc Af Amer: >60 mL/min (ref >60)
GFR calc non Af Amer: >60 mL/min (ref >60)
GLUCOSE: 117 mg/dL — AB (ref 70–99)
Glucose, Bld: 122 mg/dL — ABNORMAL HIGH (ref 70–99)
POTASSIUM: 3.3 mmol/L — AB (ref 3.5–5.1)
Potassium: 2.9 mmol/L — ABNORMAL LOW (ref 3.5–5.1)
SODIUM: 142 mmol/L (ref 135–145)
Sodium: 145 mmol/L (ref 135–145)
TOTAL PROTEIN: 5.6 g/dL — AB (ref 6.5–8.1)
Total Bilirubin: 0.6 mg/dL (ref 0.3–1.2)
Total Protein: 5.6 g/dL — ABNORMAL LOW (ref 6.5–8.1)

## 2017-10-31 LAB — CBC
HEMATOCRIT: 50.2 % — AB (ref 36.0–46.0)
HEMATOCRIT: 48.7 % — AB (ref 36.0–46.0)
HEMOGLOBIN: 16.8 g/dL — AB (ref 12.0–15.0)
HEMOGLOBIN: 15.7 g/dL — AB (ref 12.0–15.0)
MCH: 31.8 pg (ref 26.0–34.0)
MCH: 31.6 pg (ref 26.0–34.0)
MCHC: 33.5 g/dL (ref 30.0–36.0)
MCHC: 32.2 g/dL (ref 30.0–36.0)
MCV: 95.1 fL (ref 78.0–100.0)
MCV: 98 fL (ref 78.0–100.0)
PLATELETS: 310 10*3/uL (ref 150–400)
Platelets: 294 10*3/uL (ref 150–400)
RBC: 5.28 MIL/uL — ABNORMAL HIGH (ref 3.87–5.11)
RBC: 4.97 MIL/uL (ref 3.87–5.11)
RDW: 12.7 % (ref 11.5–15.5)
RDW: 12.8 % (ref 11.5–15.5)
WBC: 16.8 10*3/uL — AB (ref 4.0–10.5)
WBC: 19.7 10*3/uL — AB (ref 4.0–10.5)

## 2017-10-31 LAB — I-STAT TROPONIN, ED: Troponin i, poc: 0 ng/mL (ref 0.00–0.08)

## 2017-10-31 LAB — RAPID URINE DRUG SCREEN, HOSP PERFORMED
Amphetamines: NOT DETECTED
Amphetamines: NOT DETECTED
BARBITURATES: NOT DETECTED
Barbiturates: NOT DETECTED
Benzodiazepines: NOT DETECTED
Benzodiazepines: NOT DETECTED
COCAINE: POSITIVE — AB
Cocaine: POSITIVE — AB
Opiates: NOT DETECTED
Opiates: NOT DETECTED
TETRAHYDROCANNABINOL: NOT DETECTED
Tetrahydrocannabinol: NOT DETECTED

## 2017-10-31 LAB — ETHANOL: Alcohol, Ethyl (B): <10 mg/dL (ref <10)

## 2017-10-31 LAB — I-STAT BETA HCG BLOOD, ED (MC, WL, AP ONLY): I-stat hCG, quantitative: <5 m[IU]/mL (ref <5)

## 2017-10-31 LAB — SALICYLATE LEVEL: Salicylate Lvl: <7 mg/dL (ref 2.8–30.0)

## 2017-10-31 LAB — ACETAMINOPHEN LEVEL: Acetaminophen (Tylenol), Serum: <10 ug/mL — ABNORMAL LOW (ref 10–30)

## 2017-10-31 MED ORDER — SERTRALINE HCL 50 MG PO TABS: 50.0000 mg | ORAL_TABLET | Freq: Every day | ORAL | 0 refills | Status: DC

## 2017-10-31 MED ORDER — ACETAMINOPHEN 325 MG PO TABS
650.0000 mg | ORAL_TABLET | Freq: Once | ORAL | Status: AC
  Administered 2017-10-31: 650 mg via ORAL
  Filled 2017-10-31: qty 2

## 2017-10-31 MED ORDER — SODIUM CHLORIDE 0.9 % IV BOLUS
1000.0000 mL | Freq: Once | INTRAVENOUS | Status: AC
  Administered 2017-10-31: 1000 mL via INTRAVENOUS

## 2017-10-31 NOTE — ED Notes (Signed)
Patient verbalizes understanding of discharge instructions. Opportunity for questioning and answers were provided. Armband removed by staff, pt discharged from ED.  

## 2017-10-31 NOTE — ED Notes (Signed)
Patient transported to X-ray 

## 2017-10-31 NOTE — ED Notes (Signed)
Pt returned from xray
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
## 2017-10-31 NOTE — Progress Notes (Signed)
CSW spoke with pt at bedside. CSW expressed stressors related to past life situation. CSW was asked for resources regarding shelters a substance use rehab. CSW provided pt with paper resources for shelter and SA rehab as pt reported not having access to a phone so that CSW could  send information through [facility]. CSW spoke with pt about a safe place and possible getting [facility]. Pt expressed that pt is understanding and agreeable to seeking further services through resources provided. At this time there are no further CSW needs as RN has been updated and PA at this time. CSW will sign off.    Claude MangesKierra S. Buddy Loeffelholz, MSW, LCSW-A Emergency Department Clinical Social Worker [phone]

## 2017-10-31 NOTE — Discharge Instructions (Signed)
Return to ED for worsening symptoms, severe chest pain or headache, blurry vision, injuries or falls, trouble breathing or trouble swallowing.

## 2017-10-31 NOTE — Progress Notes (Signed)
Patient is seen by me via tele-psych today and I have consulted with Dr. Lucianne MussKumar.  Patient denies any SI/HI/AVH and contracts for safety.  Patient states that Zoloft has worked for her in the past and she agrees to follow up with an outpatient provider.  Contacted Dr. Rush Landmarkegeler and his PA Dahlia ClientHannah and notified him of the recommendations.  Patient does not meet inpatient criteria and is psychiatrically cleared.

## 2017-10-31 NOTE — ED Notes (Signed)
Staffing aware of need for sitter 

## 2017-10-31 NOTE — ED Triage Notes (Signed)
Pt states she ran out of insurance and has not had her medication for her BP in about a year
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
## 2017-10-31 NOTE — ED Notes (Signed)
Pt ambulatory to restroom  Steady gait

## 2017-10-31 NOTE — ED Notes (Signed)
Social work bedside.

## 2017-10-31 NOTE — ED Notes (Signed)
TTS bedside 

## 2017-11-01 ENCOUNTER — Other Ambulatory Visit: Payer: Self-pay

## 2017-11-01 ENCOUNTER — Inpatient Hospital Stay (HOSPITAL_COMMUNITY)
Admission: AD | Admit: 2017-11-01 | Discharge: 2017-11-11 | DRG: 882 | Disposition: A | Discharge status: Home / Self Care | Payer: Federal, State, Local not specified - Other | Source: Intra-hospital | Attending: Psychiatry | Admitting: Psychiatry

## 2017-11-01 ENCOUNTER — Encounter (HOSPITAL_COMMUNITY): Payer: Self-pay

## 2017-11-01 DIAGNOSIS — Z62810 Personal history of physical and sexual abuse in childhood: Secondary | ICD-10-CM | POA: Diagnosis present

## 2017-11-01 DIAGNOSIS — Z23 Encounter for immunization: Secondary | ICD-10-CM | POA: Diagnosis not present

## 2017-11-01 DIAGNOSIS — Z79899 Other long term (current) drug therapy: Secondary | ICD-10-CM

## 2017-11-01 DIAGNOSIS — M549 Dorsalgia, unspecified: Secondary | ICD-10-CM | POA: Diagnosis present

## 2017-11-01 DIAGNOSIS — F332 Major depressive disorder, recurrent severe without psychotic features: Secondary | ICD-10-CM

## 2017-11-01 DIAGNOSIS — F1410 Cocaine abuse, uncomplicated: Secondary | ICD-10-CM

## 2017-11-01 DIAGNOSIS — R45851 Suicidal ideations: Secondary | ICD-10-CM

## 2017-11-01 DIAGNOSIS — G4709 Other insomnia: Secondary | ICD-10-CM | POA: Diagnosis present

## 2017-11-01 DIAGNOSIS — F329 Major depressive disorder, single episode, unspecified: Secondary | ICD-10-CM | POA: Diagnosis present

## 2017-11-01 DIAGNOSIS — Z885 Allergy status to narcotic agent status: Secondary | ICD-10-CM

## 2017-11-01 DIAGNOSIS — I10 Essential (primary) hypertension: Secondary | ICD-10-CM | POA: Diagnosis present

## 2017-11-01 DIAGNOSIS — Z886 Allergy status to analgesic agent status: Secondary | ICD-10-CM | POA: Diagnosis not present

## 2017-11-01 DIAGNOSIS — G4700 Insomnia, unspecified: Secondary | ICD-10-CM | POA: Diagnosis not present

## 2017-11-01 DIAGNOSIS — F4310 Post-traumatic stress disorder, unspecified: Principal | ICD-10-CM | POA: Diagnosis present

## 2017-11-01 DIAGNOSIS — F411 Generalized anxiety disorder: Secondary | ICD-10-CM | POA: Diagnosis present

## 2017-11-01 DIAGNOSIS — F419 Anxiety disorder, unspecified: Secondary | ICD-10-CM | POA: Diagnosis not present

## 2017-11-01 DIAGNOSIS — F17210 Nicotine dependence, cigarettes, uncomplicated: Secondary | ICD-10-CM | POA: Diagnosis not present

## 2017-11-01 LAB — POTASSIUM: Potassium: 3.7 mmol/L (ref 3.5–5.1)

## 2017-11-01 MED ORDER — TRAZODONE HCL 50 MG PO TABS
50.0000 mg | ORAL_TABLET | Freq: Every evening | ORAL | Status: DC | PRN
  Administered 2017-11-01 – 2017-11-03 (×3): 50 mg via ORAL
  Filled 2017-11-01 (×3): qty 1

## 2017-11-01 MED ORDER — ALUM & MAG HYDROXIDE-SIMETH 200-200-20 MG/5ML PO SUSP
30.0000 mL | ORAL | Status: DC | PRN
  Administered 2017-11-06: 30 mL via ORAL
  Filled 2017-11-01: qty 30

## 2017-11-01 MED ORDER — PNEUMOCOCCAL VAC POLYVALENT 25 MCG/0.5ML IJ INJ
0.5000 mL | INJECTION | INTRAMUSCULAR | Status: AC
  Administered 2017-11-02: 0.5 mL via INTRAMUSCULAR

## 2017-11-01 MED ORDER — POTASSIUM CHLORIDE CRYS ER 20 MEQ PO TBCR
40.0000 meq | EXTENDED_RELEASE_TABLET | ORAL | Status: DC
  Administered 2017-11-01: 40 meq via ORAL
  Filled 2017-11-01: qty 2

## 2017-11-01 MED ORDER — SERTRALINE HCL 50 MG PO TABS
50.0000 mg | ORAL_TABLET | Freq: Every day | ORAL | Status: DC
  Administered 2017-11-01 – 2017-11-07 (×7): 50 mg via ORAL
  Filled 2017-11-01 (×10): qty 1

## 2017-11-01 MED ORDER — NICOTINE 21 MG/24HR TD PT24
21.0000 mg | MEDICATED_PATCH | Freq: Every day | TRANSDERMAL | Status: DC
  Filled 2017-11-01 (×8): qty 1

## 2017-11-01 MED ORDER — ENSURE ENLIVE PO LIQD
237.0000 mL | Freq: Two times a day (BID) | ORAL | Status: DC
  Administered 2017-11-02 – 2017-11-10 (×11): 237 mL via ORAL

## 2017-11-01 MED ORDER — MAGNESIUM HYDROXIDE 400 MG/5ML PO SUSP: 30.0000 mL | Freq: Every day | ORAL | Status: DC | PRN

## 2017-11-01 MED ORDER — PRAZOSIN HCL 1 MG PO CAPS
1.0000 mg | ORAL_CAPSULE | Freq: Every day | ORAL | Status: DC
  Administered 2017-11-01 – 2017-11-10 (×11): 1 mg via ORAL
  Filled 2017-11-01 (×12): qty 1

## 2017-11-01 MED ORDER — HYDROXYZINE HCL 25 MG PO TABS
25.0000 mg | ORAL_TABLET | Freq: Three times a day (TID) | ORAL | Status: DC | PRN
  Administered 2017-11-02 – 2017-11-11 (×13): 25 mg via ORAL
  Filled 2017-11-01 (×8): qty 1
  Filled 2017-11-01: qty 10
  Filled 2017-11-01 (×5): qty 1

## 2017-11-01 MED ORDER — ACETAMINOPHEN 325 MG PO TABS
650.0000 mg | ORAL_TABLET | Freq: Four times a day (QID) | ORAL | Status: DC | PRN
  Administered 2017-11-02 – 2017-11-10 (×9): 650 mg via ORAL
  Filled 2017-11-01 (×8): qty 2

## 2017-11-01 NOTE — [facility] Group Notes (Signed)
[facility] Group Notes:  Nursing Psychoeducational Group  Date:  11/01/2017  Time:  1:00 PM  Group: Life Skills  Facilitator: Patty D. RN  Type of Therapy:  Psychoeducational Skills  Participation Level:  Did Not Attend  Comment: Patient was invited but declined to attend group.  Freddi Forster A Nidhi Jacome 11/01/2017, 2:00 PM 

## 2017-11-01 NOTE — ED Notes (Signed)
TTS begun, gave pt a soda to assist her in waking up.

## 2017-11-01 NOTE — Tx Team (Signed)
Initial Treatment Plan 11/01/2017 3:47 PM Rachel Knapp [id number]    PATIENT STRESSORS: Financial difficulties Marital or family conflict Medication change or noncompliance Occupational concerns Substance abuse   PATIENT STRENGTHS: Average or above average intelligence Communication skills   PATIENT IDENTIFIED PROBLEMS: Depression  "I need to work on my self esteem"  "I feel empty"  Anxiety  Substance abuse/relapse prevention  Family conflict  Suicide risk          DISCHARGE CRITERIA:  Improved stabilization in mood, thinking, and/or behavior Need for constant or close observation no longer present Verbal commitment to aftercare and medication compliance Withdrawal symptoms are absent or subacute and managed without 24-hour nursing intervention  PRELIMINARY DISCHARGE PLAN: Attend 12-step recovery group Outpatient therapy Placement in alternative living arrangements  PATIENT/FAMILY INVOLVEMENT: This treatment plan has been presented to and reviewed with the patient, Rachel Knapp..  The patient and family have been given the opportunity to ask questions and make suggestions.  Cranford MonBeaudry, Rafiq Bucklin Evans, RN 11/01/2017, 3:47 PM

## 2017-11-01 NOTE — ED Notes (Signed)
Lunch tray ordered 

## 2017-11-01 NOTE — Progress Notes (Signed)
D:  Rachel Knapp has been in her room all evening.  She was sitting on her bed with her feet elevated.  She complained of pain 10/10 and Ice pack given for her to alternate feet.  She reported the ice has been helpful and is able to rest a little better.  She denied SI/HI or A/V hallucinations.  She stated that she feels depressed and has so since being off her Zoloft.  Encouraged her to elevate her feet and notify RN if she needs more ice.  She took her HS medications without difficulty.   A:  1:1 with RN for support and encouragement.  Medications as ordered.  Q 15 minute checks maintained for safety.  Encouraged participation in group and unit activities.   R:  Rachel Knapp remains safe on the unit.  We will continue to monitor the progress towards her goals.

## 2017-11-01 NOTE — Progress Notes (Signed)
[facility] 08:00. To Dr. Jola Babinskilary, MD. Call to report [phone]. ED staff advised.   Princess BruinsAquicha Anaira Seay, MSW, LCSW Therapeutic Triage Specialist  [phone]
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
## 2017-11-01 NOTE — ED Notes (Signed)
ALL Belongings - 1 labeled belongings bag and 1 valuables envelope - [place]. Pt aware.
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
## 2017-11-01 NOTE — Progress Notes (Signed)
Per Donell SievertSpencer Simon, PA pt meets criteria for inpt treatment. [facility]. EDP Garlon HatchetSanders, Lisa M, PA-C and pt's nurse Daun PeacockGloster, Jennifer B, RN have been advised.  Princess BruinsAquicha Yong Wahlquist, MSW, LCSW Therapeutic Triage Specialist  [phone]

## 2017-11-01 NOTE — Progress Notes (Signed)
Both feet bandaged bilaterally.  Patient states that they feel better.  She is requesting anxiety medication, however, she was informed that she would need to talk to MD.  I also informed her that she could not provide a MD that was prescribing it for her.

## 2017-11-01 NOTE — Progress Notes (Signed)
Psychoeducational Group Note  Date:  11/01/2017 Time:  2207  Group Topic/Focus:  Wrap-Up Group:   The focus of this group is to help patients review their daily goal of treatment and discuss progress on daily workbooks.  Participation Level: Did Not Attend  Participation Quality:  Not Applicable  Affect:  Not Applicable  Cognitive:  Not Applicable  Insight:  Not Applicable  Engagement in Group: Not Applicable  Additional Comments:  The patient did not attend group this evening since is having her blisters treated and is attempting to stay off of her feet.   Rachel Knapp 11/01/2017, 10:07 PM

## 2017-11-01 NOTE — Plan of Care (Signed)
  Problem: Health Behavior/Discharge Planning: Goal: Compliance with therapeutic regimen will improve Outcome: Progressing-pt just arrived on the unit.  She is having difficulty walking due to the blisters on her feet.  She took her hs medications and will notify staff of her needs.  We will continue to monitor the progress towards her goals.
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
## 2017-11-01 NOTE — ED Notes (Addendum)
Pt voiced understanding and agreement w/tx plan - [facility]. Pt signed consent form - [facility], copy sent to Medical Records, and original [facility]. RN contacted pt's mother as requested and advised of tx plan Steward Drone- Brenda - [phone].

## 2017-11-02 DIAGNOSIS — F1410 Cocaine abuse, uncomplicated: Secondary | ICD-10-CM | POA: Diagnosis present

## 2017-11-02 MED ORDER — GABAPENTIN 300 MG PO CAPS
300.0000 mg | ORAL_CAPSULE | Freq: Three times a day (TID) | ORAL | Status: DC
  Administered 2017-11-02 – 2017-11-05 (×9): 300 mg via ORAL
  Filled 2017-11-02 (×12): qty 1

## 2017-11-02 MED ORDER — BACITRACIN-NEOMYCIN-POLYMYXIN OINTMENT TUBE
TOPICAL_OINTMENT | Freq: Three times a day (TID) | CUTANEOUS | Status: DC | PRN
  Administered 2017-11-02 – 2017-11-03 (×2): via TOPICAL
  Filled 2017-11-02: qty 14.17

## 2017-11-02 NOTE — Progress Notes (Signed)
Patient attended the evening A.A. meeting and was appropriate.  

## 2017-11-02 NOTE — Progress Notes (Signed)
D:  Monte has been up more today.  She was pleasant and cooperative.  She denied SI/HI or A/V hallucinations.  She reported her day was better than yesterday.  She is complaining of anxiety and wants to talk with the doctor tomorrow about something else to help instead of the vistaril.  Given journal to make notes about the issues she wants to talk about with the doctor.  She continues to complain of pain in both of her feet.  After she took a shower, RN applied neosporin to both feet, placed telfa and wrapped with bandage.  Blisters remain and none are open at this time.  Gave her ice pack to help with the burning pain which she reported as helpful last night.  She attended evening group.  She interacts well with staff and peers.  PRN for sleep given per request. A:  1:1 with RN for support and encouragement.  Medications as ordered.  Q 15 minute checks maintained for safety.  Encouraged participation in group and unit activities.   R:  Saxon remains safe on the unit.  We will continue to monitor the progress towards her goals.

## 2017-11-02 NOTE — Progress Notes (Signed)
Nutrition Brief Note  Patient identified on the Malnutrition Screening Tool (MST) Report  Wt Readings from Last 15 Encounters:  11/01/17 63.5 kg  11/26/14 63.5 kg  09/07/14 68 kg    Body mass index is 23.3 kg/m. Patient meets criteria for normal based on current BMI.    Labs and medications reviewed.   No nutrition interventions warranted at this time. If nutrition issues arise, please consult RD.   Tilda Franco, MS, RD, LDN [facility] Inpatient Clinical Dietitian Pager: [phone] After Hours Pager: [phone]
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
## 2017-11-02 NOTE — Progress Notes (Signed)
Adult Psychoeducational Group Note  Date:  11/02/2017 Time: 1300  Group Topic/Focus:  Coping with Anger  Participation Level:  Active  Participation Quality:  Appropriate  Affect:  Appropriate  Cognitive:  Appropriate  Insight: Appropriate  Engagement in Group:  Engaged  Modes of Intervention:  Discussion and Education  Additional Comments:     

## 2017-11-02 NOTE — [facility] Group Notes (Signed)
Pt was invited but did not attend orientation/goals group. 
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
## 2017-11-02 NOTE — [facility] Group Notes (Signed)
[facility] LCSW Group Therapy Note  11/02/2017  10:00-11:00AM  Type of Therapy and Topic:  Group Therapy:  Being Your Own Support  Participation Level:  Did Not Attend   Description of Group:  Patients in this group were introduced to the concept that self-support is an essential part of recovery.  A song entitled "My Own Hero" was played and a group discussion ensued in which patients stated they could relate to the song and it inspired them to realize they have be willing to help themselves in order to succeed, because other people cannot achieve sobriety or stability for them.  We discussed adding a variety of healthy supports to address the various needs in their lives.  A song was played called "I Know Where I've Been" toward the end of group and used to conduct an inspirational wrap-up to group of remembering how far they have already come in their journey.  Therapeutic Goals: 1)  demonstrate the importance of being a part of one's own support system 2)  discuss reasons people in one's life may eventually be unable to be continually supportive  3)  identify the patient's current support system and   4)  elicit commitments to add healthy supports and to become more conscious of being self-supportive   Summary of Patient Progress:  N/A   Therapeutic Modalities:   Motivational Interviewing Activity  Victorhugo Preis J Grossman-Orr       

## 2017-11-02 NOTE — Plan of Care (Signed)
  Problem: Activity: Goal: Interest or engagement in leisure activities will improve Outcome: Progressing-Rachel Knapp was out of her room more today.  She attended groups and was interacting well with peers.

## 2017-11-02 NOTE — Progress Notes (Signed)
Pt presents with a flat affect and depressed mood. Pt rated on her self inventory sheet: depression 7/10, anxiety 8/10 and hopelessness 8/10. Pt endorses passive SI with no plan or intent. Pt verbally contracts for safety. Pt stated goal for today, "my pain and headache under control". Pt offered tylenol for pain. An  ice pack provided to pt for discomfort. Writer offered pt a wheelchair due to severe pain in bilat feet, pt declined offer. Pt compliant with taking meds and denies any side effects.   Medications administered as ordered per MD. Verbal support provided. Pt encouraged to attend groups. 15 minute checks performed for safety. Pt encouraged to complete suicide safety plan worksheet.   Pt compliant with tx plan.

## 2017-11-02 NOTE — Plan of Care (Signed)
  Problem: Activity: Goal: Interest or engagement in activities will improve Outcome: Progressing   Problem: Coping: Goal: Ability to demonstrate self-control will improve Outcome: Progressing   

## 2017-11-03 MED ORDER — CYCLOBENZAPRINE HCL 5 MG PO TABS
5.0000 mg | ORAL_TABLET | Freq: Three times a day (TID) | ORAL | Status: DC
  Administered 2017-11-03 – 2017-11-04 (×3): 5 mg via ORAL
  Filled 2017-11-03 (×7): qty 1

## 2017-11-03 NOTE — Plan of Care (Signed)
Problem: Safety: Goal: Periods of time without injury will increase Outcome: Progressing   Problem: Coping: Goal: Ability to identify and develop effective coping behavior will improve Outcome: Progressing D: Pt presents with flat affect and depressed mood on initial contact.  Brightened up and forwards on interactions as shift progressed. A & O X3. Denies SI, HI, AVH and pain at this time. Reports fair sleep last night with fair appetite, low energy and good concentration level on self inventory sheet. Rates her depression and hopelessness 8/10, anxiety 10/10 "I'm just stressed from my pain, I use to take [facility] home for my stiffness / pain, I need it again because my lower back and arthritis acting up". Pt off unit for activities and meals; return without issues. A: Emotional support and availability offered. All medications given as ordered with verbal education and effects monitored. Encouraged pt to voice concerns and comply with treatment regimen including groups. Updated pt on changes made to [facility]). Safety checks maintained without self harm gestures or outburst. R: Pt remains medication compliant. Denies adverse drug reactions. Remains safe on and off unit.

## 2017-11-03 NOTE — Progress Notes (Signed)
[facility] referral faxed. [facility] admissions closed today-unable to schedule screening. CSW will attempt tomorrow.  Yizel Canby S. Alan Ripper, MSW, LCSW Clinical Social Worker 11/03/2017 11:00 AM

## 2017-11-03 NOTE — Progress Notes (Signed)
[facility] MD Progress Note  11/03/2017 1:12 PM Rachel Knapp  MRN:  [id number] Subjective: Patient is seen and examined.  Patient is a 47 year old female with a past psychiatric history significant for generalized anxiety disorder, major depression, posttraumatic stress disorder and cocaine use disorder.  She is seen in follow-up.  She is still dysphoric.  She is bit irritable.  She stated she is in pain.  She stated that she would like to have her Flexeril restarted.  We discussed that.  She stated she thinks she had a mental breakdown, and that she does not really want to use cocaine.  She is happy that she is back on the Zoloft which she would receive from [facility].  She denied any current suicidal ideation.  She admitted she was still depressed. Principal Problem: MDD (major depressive disorder), recurrent severe, without psychosis (HCC) Diagnosis:   Patient Active Problem List   Diagnosis Date Noted  . Cocaine abuse (HCC) [F14.10] 11/02/2017  . MDD (major depressive disorder), recurrent severe, without psychosis (HCC) [F33.2] 11/01/2017   Total Time spent with patient: 15 minutes  Past Psychiatric History: See admission H&P  Past Medical History:  Past Medical History:  Diagnosis Date  . Depression   . Hypertension    History reviewed. No pertinent surgical history. Family History: History reviewed. No pertinent family history. Family Psychiatric  History: See admission H&P Social History:  Social History   Substance and Sexual Activity  Alcohol Use No     Social History   Substance and Sexual Activity  Drug Use Yes  . Types: Cocaine    Social History   Socioeconomic History  . Marital status: Single    Spouse name: Not on file  . Number of children: Not on file  . Years of education: Not on file  . Highest education level: Not on file  Occupational History  . Not on file  Social Needs  . Financial resource strain: Not on file  . Food insecurity:    Worry: Not on file     Inability: Not on file  . Transportation needs:    Medical: Not on file    Non-medical: Not on file  Tobacco Use  . Smoking status: Current Every Day Smoker    Packs/day: 0.50    Types: Cigarettes  . Smokeless tobacco: Never Used  Substance and Sexual Activity  . Alcohol use: No  . Drug use: Yes    Types: Cocaine  . Sexual activity: Not on file  Lifestyle  . Physical activity:    Days per week: Not on file    Minutes per session: Not on file  . Stress: Not on file  Relationships  . Social connections:    Talks on phone: Not on file    Gets together: Not on file    Attends religious service: Not on file    Active member of club or organization: Not on file    Attends meetings of clubs or organizations: Not on file    Relationship status: Not on file  Other Topics Concern  . Not on file  Social History Narrative  . Not on file   Additional Social History:                         Sleep: Good  Appetite:  Good  Current Medications: Current Facility-Administered Medications  Medication Dose Route Frequency Provider Last Rate Last Dose  . acetaminophen (TYLENOL) tablet 650 mg  650  mg Oral Q6H PRN Money, [name], FNP   650 mg at 11/03/17 1610  . alum & mag hydroxide-simeth (MAALOX/MYLANTA) 200-200-20 MG/5ML suspension 30 mL  30 mL Oral Q4H PRN Money, [name], FNP      . cyclobenzaprine (FLEXERIL) tablet 5 mg  5 mg Oral TID [name], MD      . feeding supplement (ENSURE ENLIVE) (ENSURE ENLIVE) liquid 237 mL  237 mL Oral BID BM [name], MD   237 mL at 11/03/17 1100  . gabapentin (NEURONTIN) capsule 300 mg  300 mg Oral TID [name], NP   300 mg at 11/03/17 1210  . hydrOXYzine (ATARAX/VISTARIL) tablet 25 mg  25 mg Oral TID PRN Money, [name], FNP   25 mg at 11/03/17 9604  . magnesium hydroxide (MILK OF MAGNESIA) suspension 30 mL  30 mL Oral Daily PRN Money, [name], FNP      . neomycin-bacitracin-polymyxin (NEOSPORIN) ointment    Topical TID PRN [name], NP      . nicotine (NICODERM CQ - dosed in mg/24 hours) patch 21 mg  21 mg Transdermal Daily [name], MD      . prazosin (MINIPRESS) capsule 1 mg  1 mg Oral QHS [name], MD   1 mg at 11/02/17 2140  . sertraline (ZOLOFT) tablet 50 mg  50 mg Oral Daily [name], MD   50 mg at 11/03/17 0827  . traZODone (DESYREL) tablet 50 mg  50 mg Oral QHS PRN Money, [name], FNP   50 mg at 11/02/17 2140    Lab Results: No results found for this or any previous visit (from the past 48 hour(s)).  Blood Alcohol level:  Lab Results  Component Value Date   ETH <10 10/31/2017   ETH <10 10/31/2017    Metabolic Disorder Labs: No results found for: HGBA1C, MPG No results found for: PROLACTIN No results found for: CHOL, TRIG, HDL, CHOLHDL, VLDL, LDLCALC  Physical Findings: AIMS: Facial and Oral Movements Muscles of Facial Expression: None, normal Lips and Perioral Area: None, normal Jaw: None, normal Tongue: None, normal,Extremity Movements Upper (arms, wrists, hands, fingers): None, normal Lower (legs, knees, ankles, toes): None, normal, Trunk Movements Neck, shoulders, hips: None, normal, Overall Severity Severity of abnormal movements (highest score from questions above): None, normal Incapacitation due to abnormal movements: None, normal Patient's awareness of abnormal movements (rate only patient's report): No Awareness, Dental Status Current problems with teeth and/or dentures?: Yes(absess L jaw/missing teeth) Does patient usually wear dentures?: No  CIWA:    COWS:     Musculoskeletal: Strength & Muscle Tone: within normal limits Gait & Station: normal Patient leans: N/A  Psychiatric Specialty Exam: Physical Exam  Nursing note and vitals reviewed. Constitutional: She is oriented to person, place, and time. She appears well-developed.  HENT:  Head: Normocephalic.  Respiratory: Effort normal.  Neurological: She is alert and  oriented to person, place, and time.    ROS  Blood pressure (!) 112/92, pulse 90, temperature 98.2 F (36.8 C), temperature source Oral, resp. rate 16, height 5\' 5"  (1.651 m), weight 63.5 kg, SpO2 100 %.Body mass index is 23.3 kg/m.  General Appearance: Casual  Eye Contact:  Fair  Speech:  Normal Rate  Volume:  Normal  Mood:  Dysphoric and Irritable  Affect:  Congruent  Thought Process:  Coherent and Descriptions of Associations: Intact  Orientation:  Full (Time, Place, and Person)  Thought Content:  Logical  Suicidal Thoughts:  No  Homicidal Thoughts:  No  Memory:  Immediate;   Fair Recent;   Fair Remote;   Fair  Judgement:  Intact  Insight:  Lacking  Psychomotor Activity:  Increased and Restlessness  Concentration:  Concentration: Fair and Attention Span: Fair  Recall:  [employer] of Knowledge:  Fair  Language:  Fair  Akathisia:  Negative  Handed:  Right  AIMS (if indicated):     Assets:  Desire for Improvement Housing Physical Health Resilience  ADL's:  Intact  Cognition:  WNL  Sleep:  Number of Hours: 6.75     Treatment Plan Summary: Daily contact with patient to assess and evaluate symptoms and progress in treatment, Medication management and Plan : Patient is seen and examined patient is a 46 year old female with a past psychiatric history significant for major depression, cocaine use disorder and posttraumatic stress disorder.-Continue Zoloft 50 mg p.o. daily for depression-Continue prazosin 1 mg p.o. nightly for nightmares and flashbacks-continue gabapentin 300 mg p.o. 3 times daily for pain-Restart Flexeril 5 mg p.o. 3 times daily for pain.-Continue hydroxyzine 25 mg p.o. 3 times daily as needed anxiety-working with social work for disposition with regard to substance abuse treatment-discharge planning currently still in process.  [name], MD 11/03/2017, 1:12 PM
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
## 2017-11-03 NOTE — Progress Notes (Signed)
Recreation Therapy Notes  Date: 11/03/17 Time: 0930 Location: [address] Dayroom  Group Topic: Stress Management  Goal Area(s) Addresses:  Patient will verbalize importance of using healthy stress management.  Patient will identify positive emotions associated with healthy stress management.   Intervention: Stress Management  Activity :  Meditation.  LRT introduced the stress management technique of meditation.  Patients were to listen and follow along as the meditation played in order to relax.  Education:  Stress Management, Discharge Planning.   Education Outcome: Acknowledges edcuation/In group clarification offered/Needs additional education  Clinical Observations/Feedback: Pt did not attend group.     Caroll Rancher, LRT/CTRS         Caroll Rancher A 11/03/2017 12:16 PM

## 2017-11-04 ENCOUNTER — Encounter (HOSPITAL_COMMUNITY): Payer: Self-pay | Admitting: Behavioral Health

## 2017-11-04 DIAGNOSIS — G4700 Insomnia, unspecified: Secondary | ICD-10-CM

## 2017-11-04 DIAGNOSIS — F419 Anxiety disorder, unspecified: Secondary | ICD-10-CM

## 2017-11-04 MED ORDER — TRAZODONE HCL 100 MG PO TABS
100.0000 mg | ORAL_TABLET | Freq: Every evening | ORAL | Status: DC | PRN
  Administered 2017-11-04 – 2017-11-06 (×3): 100 mg via ORAL
  Filled 2017-11-04 (×3): qty 1

## 2017-11-04 MED ORDER — CYCLOBENZAPRINE HCL 5 MG PO TABS
5.0000 mg | ORAL_TABLET | Freq: Three times a day (TID) | ORAL | Status: DC
Start: 2017-11-04 — End: 2017-11-09
  Administered 2017-11-04 – 2017-11-09 (×17): 5 mg via ORAL
  Filled 2017-11-04 (×21): qty 1

## 2017-11-04 NOTE — Progress Notes (Signed)
Patient self inventory- Patient slept fair last night, sleep medication was requested and was not helpful. Appetite has been fair, energy level low, concentration good. Depression, hopelessness, and anxiety rated 7, 5, 8. Denies withdrawal symptoms.Denies SI HI AVH. Endorses pain rated 9/10 in back rue to RA and nerve pain. Patient's goal is to work on "pain and sleep meds."  Patient is compliant with medications prescribed per provider.  Safety is maintained with 15 minute checks as well as environmental checks. Will continue to monitor.

## 2017-11-04 NOTE — [facility] Suicide Risk Assessment (Signed)
[facility] INPATIENT:  Family/Significant Other Suicide Prevention Education  Suicide Prevention Education:  Education Completed; Pt's mother Rogene Houston [phone] has been identified by the patient as the family member/significant other with whom the patient will be residing, and identified as the person(s) who will aid the patient in the event of a mental health crisis (suicidal ideations/suicide attempt).  With written consent from the patient, the family member/significant other has been provided the following suicide prevention education, prior to the and/or following the discharge of the patient.  The suicide prevention education provided includes the following:  Suicide risk factors  Suicide prevention and interventions  National Suicide Hotline telephone number  [facility] assessment telephone number  [facility] Emergency Assistance 911  [facility] and/or Residential Mobile Crisis Unit telephone number  Request made of family/significant other to:  Remove weapons (e.g., guns, rifles, knives), all items previously/currently identified as safety concern.    Remove drugs/medications (over-the-counter, prescriptions, illicit drugs), all items previously/currently identified as a safety concern.  The family member/significant other verbalizes understanding of the suicide prevention education information provided.  The family member/significant other agrees to remove the items of safety concern listed above.  SPE and aftercare reviewed with pt's mother. She hopes that pt can get directly into [facility] but is allowing pt to return home if she has to wait for a bed. She has no safety concerns regarding pt discharging but worries about pt relapsing if she comes home. No access to weapons/firearms.   Rona Ravens LCSW 11/04/2017, 2:27 PM

## 2017-11-04 NOTE — Plan of Care (Signed)
  Problem: Education: Goal: Verbalization of understanding the information provided will improve Outcome: Progressing   Problem: Activity: Goal: Sleeping patterns will improve Outcome: Progressing   Problem: Coping: Goal: Ability to verbalize frustrations and anger appropriately will improve Outcome: Progressing   Problem: Safety: Goal: Periods of time without injury will increase Outcome: Progressing   Problem: Education: Goal: Knowledge of disease or condition will improve Outcome: Progressing

## 2017-11-04 NOTE — Progress Notes (Signed)
Pt has been sitting in the dayroom most of the evening talking with another patient and watching TV.  She had a visit with her family this evening which she enjoyed.  She reports she had a good day.  She denies SI/HI/AVH at this time.  She did not mention any pain to her feet from the blisters prior to admission, but is focused on chronic pain in her back.  She reports that the doctor restarted her Flexeril she was taking at home.  Pt has been cooperative and appropriate on the unit.  She makes her needs known to staff.  At bedtime, pt was given her hs meds, but she asked about the Flexeril.  She stated she needed it to relax so she could fall asleep.  Pt has received two doses since 1430 this afternoon.  Pt was encouraged to speak to the doctor about changing the times of dosing so that she would have a dose before bedtime.  Support and encouragement offered.  Discharge plans are in process.  Safety maintained with q15 minute checks.

## 2017-11-04 NOTE — [facility] Group Notes (Signed)
[facility] [employer] Group Therapy 11/04/2017 1:15pm  Type of Therapy: [employer] Presentation  Participation Level: Active  Participation Quality: Attentive  Affect: Appropriate  Cognitive: Oriented  Insight: Developing/Improving  Engagement in Therapy: Engaged  Modes of Intervention: Discussion, Education and Socialization  Summary of Progress/Problems: [employer] ([facility]) Speaker came to talk about his personal journey with mental health. The pt processed ways by which to relate to the speaker. MHA speaker provided handouts and educational information pertaining to groups and services offered by the MHA. Pt was engaged in speaker's presentation and was receptive to resources provided.    Jaelyn Cloninger S Sieara Bremer, LCSW 11/04/2017 3:09 PM  

## 2017-11-04 NOTE — Progress Notes (Addendum)
[facility] MD Progress Note  11/04/2017 11:06 AM ARIYAN Knapp  MRN:  [id number]   Subjective: " I feel better compared to when I first got here. I am still hopeful that I can go to [facility] when I leave."  Evaluation on the unit: Face to face evaluation rounded with Clinical research associate and MD, chart reviewed prior to rounding as well as case discussion with treatment team.  Patient is a 47 year old female with a past psychiatric history significant for generalized anxiety disorder, major depression, posttraumatic stress disorder and cocaine use disorder.     During this evaluation patient is alert and oriented x4 and cooperative. She does appear mildly irritable although notes she is in pain secondary to chronic medical conditions. Her flexeril was restarted at 3 times per day although she reports she prefers to take one dose prior to bedtime. She endorses poor sleeping pattern even with Trazodone. Endorse no concerns with appetite.  She endorse improvement in depression compared to her admission and endorse no concern with Zoloft for depression management. She presented with a history of cocaine abuse and although she denies any withdrawal symptoms, she does endorse the urge to use mostly for pain management. She states she is open to long term treatment for her substance abuse following discharge and is hopefull to go to [facility] following discharge. She was advised that there is a waiting list and plans maybe for her to dsicharge home then transition to [facility] when a bed becomes available. She is receptive and states she will go to her mothers home if plan as noted has to happen. She denies any SI, HI or AVH and does not appear internally preoccupied. At this time, she is contracting for safety on the unit.     Principal Problem: MDD (major depressive disorder), recurrent severe, without psychosis (HCC) Diagnosis:   Patient Active Problem List   Diagnosis Date Noted  . Cocaine abuse (HCC) [F14.10] 11/02/2017  . MDD  (major depressive disorder), recurrent severe, without psychosis (HCC) [F33.2] 11/01/2017   Total Time spent with patient: 20 minutes  Past Psychiatric History: See admission H&P  Past Medical History:  Past Medical History:  Diagnosis Date  . Depression   . Hypertension    History reviewed. No pertinent surgical history. Family History: History reviewed. No pertinent family history. Family Psychiatric  History: See admission H&P Social History:  Social History   Substance and Sexual Activity  Alcohol Use No     Social History   Substance and Sexual Activity  Drug Use Yes  . Types: Cocaine    Social History   Socioeconomic History  . Marital status: Single    Spouse name: Not on file  . Number of children: Not on file  . Years of education: Not on file  . Highest education level: Not on file  Occupational History  . Not on file  Social Needs  . Financial resource strain: Not on file  . Food insecurity:    Worry: Not on file    Inability: Not on file  . Transportation needs:    Medical: Not on file    Non-medical: Not on file  Tobacco Use  . Smoking status: Current Every Day Smoker    Packs/day: 0.50    Types: Cigarettes  . Smokeless tobacco: Never Used  Substance and Sexual Activity  . Alcohol use: No  . Drug use: Yes    Types: Cocaine  . Sexual activity: Not on file  Lifestyle  . Physical activity:  Days per week: Not on file    Minutes per session: Not on file  . Stress: Not on file  Relationships  . Social connections:    Talks on phone: Not on file    Gets together: Not on file    Attends religious service: Not on file    Active member of club or organization: Not on file    Attends meetings of clubs or organizations: Not on file    Relationship status: Not on file  Other Topics Concern  . Not on file  Social History Narrative  . Not on file   Additional Social History:                         Sleep: Poor  Appetite:   Good  Current Medications: Current Facility-Administered Medications  Medication Dose Route Frequency Provider Last Rate Last Dose  . acetaminophen (TYLENOL) tablet 650 mg  650 mg Oral Q6H PRN Money, [name], FNP   650 mg at 11/03/17 1610  . alum & mag hydroxide-simeth (MAALOX/MYLANTA) 200-200-20 MG/5ML suspension 30 mL  30 mL Oral Q4H PRN Money, [name], FNP      . cyclobenzaprine (FLEXERIL) tablet 5 mg  5 mg Oral TID Jakerra Floyd A, MD      . feeding supplement (ENSURE ENLIVE) (ENSURE ENLIVE) liquid 237 mL  237 mL Oral BID BM [name], MD   237 mL at 11/03/17 1715  . gabapentin (NEURONTIN) capsule 300 mg  300 mg Oral TID [name], NP   300 mg at 11/04/17 9604  . hydrOXYzine (ATARAX/VISTARIL) tablet 25 mg  25 mg Oral TID PRN Money, [name], FNP   25 mg at 11/03/17 5409  . magnesium hydroxide (MILK OF MAGNESIA) suspension 30 mL  30 mL Oral Daily PRN Money, [name], FNP      . neomycin-bacitracin-polymyxin (NEOSPORIN) ointment   Topical TID PRN [name], NP      . nicotine (NICODERM CQ - dosed in mg/24 hours) patch 21 mg  21 mg Transdermal Daily [name], MD      . prazosin (MINIPRESS) capsule 1 mg  1 mg Oral QHS [name], MD   1 mg at 11/03/17 2120  . sertraline (ZOLOFT) tablet 50 mg  50 mg Oral Daily [name], MD   50 mg at 11/04/17 0806  . traZODone (DESYREL) tablet 100 mg  100 mg Oral QHS PRN Williamson Cavanah, Rockey Situ, MD        Lab Results: No results found for this or any previous visit (from the past 48 hour(s)).  Blood Alcohol level:  Lab Results  Component Value Date   ETH <10 10/31/2017   ETH <10 10/31/2017    Metabolic Disorder Labs: No results found for: HGBA1C, MPG No results found for: PROLACTIN No results found for: CHOL, TRIG, HDL, CHOLHDL, VLDL, LDLCALC  Physical Findings: AIMS: Facial and Oral Movements Muscles of Facial Expression: None, normal Lips and Perioral Area: None, normal Jaw: None, normal Tongue:  None, normal,Extremity Movements Upper (arms, wrists, hands, fingers): None, normal Lower (legs, knees, ankles, toes): None, normal, Trunk Movements Neck, shoulders, hips: None, normal, Overall Severity Severity of abnormal movements (highest score from questions above): None, normal Incapacitation due to abnormal movements: None, normal Patient's awareness of abnormal movements (rate only patient's report): No Awareness, Dental Status Current problems with teeth and/or dentures?: Yes(absess L jaw/missing teeth) Does patient usually wear dentures?: No  CIWA:    COWS:     Musculoskeletal: Strength & Muscle Tone: within normal limits Gait & Station: normal Patient leans: N/A  Psychiatric Specialty Exam: Physical Exam  Nursing note and vitals reviewed. Constitutional: She is oriented to person, place, and time. She appears well-developed.  HENT:  Head: Normocephalic.  Respiratory: Effort normal.  Neurological: She is alert and oriented to person, place, and time.    Review of Systems  Psychiatric/Behavioral: Positive for depression and substance abuse. Negative for hallucinations, memory loss and suicidal ideas. The patient is nervous/anxious and has insomnia.   All other systems reviewed and are negative.   Blood pressure (!) 151/82, pulse (!) 101, temperature 98.2 F (36.8 C), temperature source Oral, resp. rate 16, height 5\' 5"  (1.651 m), weight 63.5 kg, SpO2 100 %.Body mass index is 23.3 kg/m.  General Appearance: Casual  Eye Contact:  Fair  Speech:  Normal Rate  Volume:  Normal  Mood:  Depressed; slightly irritable   Affect:  Congruent  Thought Process:  Coherent and Descriptions of Associations: Intact  Orientation:  Full (Time, Place, and Person)  Thought Content:  Logical  Suicidal Thoughts:  No  Homicidal Thoughts:  No  Memory:  Immediate;   Fair Recent;   Fair Remote;   Fair  Judgement:  Intact  Insight:  Lacking  Psychomotor Activity:  Increased and  Restlessness  Concentration:  Concentration: Fair and Attention Span: Fair  Recall:  [employer] of Knowledge:  Fair  Language:  Fair  Akathisia:  Negative  Handed:  Right  AIMS (if indicated):     Assets:  Desire for Improvement Housing Physical Health Resilience  ADL's:  Intact  Cognition:  WNL  Sleep:  Number of Hours: 6.75     Treatment Plan Summary: Reviewed current treatment plan. Will continue the following plan with adjustments where noted.   Depressed mood without psychosis-Endorses slow improvement. Continued Zoloft 50 mg po daily.   Insomnia-Not improving. Increased Trazodone to 100 mg po daily at bedtime as needed.  Nightmares/flasbacks- Stable. Continued prazosin 1 mg p.o. nightly   Pain management- This is a chronic condition. Continued gabapentin 300 mg p.o. 3 times daily for pain and  Flexeril 5 mg p.o. 3 times daily for pain (flexaril schedule changed. See MAR). Discussed increasing gabapentin in the future if pain continues without improvement.    Anxiety-Slow improvement. Continued  hydroxyzine 25 mg p.o. 3 times daily as needed anxiety-  Other:  Safety: Will continue 15 minute observation for safety checks. Patient is able to contract for safety on the unit at this time  Continue to develop treatment plan to decrease risk of relapse upon discharge and to reduce the need for readmission.  Psycho-social education regarding relapse prevention and self care.  Health care follow up as needed for medical problems.  Continue to attend and participate in therapy.   Discharge: working with social work for disposition with regard to substance abuse treatment-discharge planning currently still in process. Referral has been made to [facility] but patient is aware that she may have to transition to [facility] from home when a bed is available.      [name], NP 11/04/2017, 11:06 AM   Patient ID: Rachel Knapp, female   DOB: 09-14-1970, 47 y.o.   MRN:  [id number] .Marland KitchenAgree with NP Progress Note

## 2017-11-04 NOTE — Plan of Care (Signed)
  Problem: Education: Goal: Emotional status will improve 11/04/2017 0953 by Dewayne Shorter, RN Outcome: Progressing 11/04/2017 0945 by Dewayne Shorter, RN Outcome: Progressing   Problem: Education: Goal: Mental status will improve 11/04/2017 0953 by Dewayne Shorter, RN Outcome: Progressing 11/04/2017 0945 by Dewayne Shorter, RN Outcome: Progressing   Problem: Education: Goal: Verbalization of understanding the information provided will improve 11/04/2017 0953 by Dewayne Shorter, RN Outcome: Progressing 11/04/2017 0945 by Dewayne Shorter, RN Outcome: Progressing   Problem: Activity: Goal: Interest or engagement in activities will improve 11/04/2017 0953 by Dewayne Shorter, RN Outcome: Progressing 11/04/2017 0945 by Dewayne Shorter, RN Outcome: Progressing   Problem: Activity: Goal: Sleeping patterns will improve 11/04/2017 0953 by Dewayne Shorter, RN Outcome: Progressing 11/04/2017 0945 by Dewayne Shorter, RN Outcome: Progressing

## 2017-11-04 NOTE — Progress Notes (Signed)
[facility] Group Notes:  (Nursing/MHT/Case Management/Adjunct)  Date:  11/04/2017  Time:  4:00 PM  Type of Therapy:  Nurse Education  Participation Level:  Active  Participation Quality:  Appropriate, Sharing and Supportive  Affect:  Appropriate  Cognitive:  Alert and Oriented  Insight:  Appropriate and Improving  Engagement in Group:  Engaged and Supportive  Modes of Intervention:  Discussion and Education  Summary of Progress/Problems: Patient participated appropriately and insight is improving.   Rachel Knapp 11/04/2017, 5:43 PM 

## 2017-11-04 NOTE — Progress Notes (Signed)
Pt has phone screening with Shayla in admissions at [facility] at 1:20PM today.  Mirabel Ahlgren S. Alan Ripper, MSW, LCSW Clinical Social Worker 11/04/2017 10:54 AM

## 2017-11-05 ENCOUNTER — Encounter (HOSPITAL_COMMUNITY): Payer: Self-pay | Admitting: Behavioral Health

## 2017-11-05 MED ORDER — GABAPENTIN 400 MG PO CAPS
400.0000 mg | ORAL_CAPSULE | Freq: Three times a day (TID) | ORAL | Status: DC
  Administered 2017-11-05 – 2017-11-11 (×18): 400 mg via ORAL
  Filled 2017-11-05 (×21): qty 1

## 2017-11-05 NOTE — Progress Notes (Addendum)
[facility] MD Progress Note  11/05/2017 9:47 AM GRIFFIN TAKATA  MRN:  [id number]   Subjective: " I am still having a lot of pain. I had a phone interview with [facility] and I hope I can get a bed."  Evaluation on the unit: Face to face evaluation rounded with Clinical research associate and MD, chart reviewed prior to rounding as well as case discussion with treatment team.  Patient is a 47 year old female with a past psychiatric history significant for generalized anxiety disorder, major depression, posttraumatic stress disorder and cocaine use disorder.     During this evaluation patient is alert and oriented x4 and cooperative. Patient continues to endorse mildly depressed mood and anxiety. She does endorse that both depression and anxiety is with slight improvement after having a phone interview with [facility]. Social worker will follow-up with [facility] regarding bed availability. Patient denies any SI, HI or AVH at this time, She continues to endorse ongoing back pain which is chronic. She is actively participation gin unit milieu without any behavioral concerns. Her mood is less irritable today. She denies concerns with appetite, resting pattern or current medications. She does endorse sleeping better with increased dose of Trazodone. At this time, she is contracting for safety on the unit.     Principal Problem: MDD (major depressive disorder), recurrent severe, without psychosis (HCC) Diagnosis:   Patient Active Problem List   Diagnosis Date Noted  . Cocaine abuse (HCC) [F14.10] 11/02/2017  . MDD (major depressive disorder), recurrent severe, without psychosis (HCC) [F33.2] 11/01/2017   Total Time spent with patient: 20 minutes  Past Psychiatric History: See admission H&P  Past Medical History:  Past Medical History:  Diagnosis Date  . Depression   . Hypertension    History reviewed. No pertinent surgical history. Family History: History reviewed. No pertinent family history. Family Psychiatric  History: See  admission H&P Social History:  Social History   Substance and Sexual Activity  Alcohol Use No     Social History   Substance and Sexual Activity  Drug Use Yes  . Types: Cocaine    Social History   Socioeconomic History  . Marital status: Single    Spouse name: Not on file  . Number of children: Not on file  . Years of education: Not on file  . Highest education level: Not on file  Occupational History  . Not on file  Social Needs  . Financial resource strain: Not on file  . Food insecurity:    Worry: Not on file    Inability: Not on file  . Transportation needs:    Medical: Not on file    Non-medical: Not on file  Tobacco Use  . Smoking status: Current Every Day Smoker    Packs/day: 0.50    Types: Cigarettes  . Smokeless tobacco: Never Used  Substance and Sexual Activity  . Alcohol use: No  . Drug use: Yes    Types: Cocaine  . Sexual activity: Not on file  Lifestyle  . Physical activity:    Days per week: Not on file    Minutes per session: Not on file  . Stress: Not on file  Relationships  . Social connections:    Talks on phone: Not on file    Gets together: Not on file    Attends religious service: Not on file    Active member of club or organization: Not on file    Attends meetings of clubs or organizations: Not on file    Relationship  status: Not on file  Other Topics Concern  . Not on file  Social History Narrative  . Not on file   Additional Social History:                         Sleep: improved  Appetite:  Good  Current Medications: Current Facility-Administered Medications  Medication Dose Route Frequency Provider Last Rate Last Dose  . acetaminophen (TYLENOL) tablet 650 mg  650 mg Oral Q6H PRN Money, [name], FNP   650 mg at 11/03/17 7829  . alum & mag hydroxide-simeth (MAALOX/MYLANTA) 200-200-20 MG/5ML suspension 30 mL  30 mL Oral Q4H PRN Money, [name], FNP      . cyclobenzaprine (FLEXERIL) tablet 5 mg  5 mg Oral TID  Cobos, Rockey Situ, MD   5 mg at 11/05/17 0807  . feeding supplement (ENSURE ENLIVE) (ENSURE ENLIVE) liquid 237 mL  237 mL Oral BID BM [name], MD   237 mL at 11/04/17 1710  . gabapentin (NEURONTIN) capsule 400 mg  400 mg Oral TID [name], NP      . hydrOXYzine (ATARAX/VISTARIL) tablet 25 mg  25 mg Oral TID PRN Money, [name], FNP   25 mg at 11/03/17 5621  . magnesium hydroxide (MILK OF MAGNESIA) suspension 30 mL  30 mL Oral Daily PRN Money, [name], FNP      . neomycin-bacitracin-polymyxin (NEOSPORIN) ointment   Topical TID PRN [name], NP      . nicotine (NICODERM CQ - dosed in mg/24 hours) patch 21 mg  21 mg Transdermal Daily [name], MD      . prazosin (MINIPRESS) capsule 1 mg  1 mg Oral QHS [name], MD   1 mg at 11/04/17 2121  . sertraline (ZOLOFT) tablet 50 mg  50 mg Oral Daily [name], MD   50 mg at 11/05/17 3086  . traZODone (DESYREL) tablet 100 mg  100 mg Oral QHS PRN Cobos, Rockey Situ, MD   100 mg at 11/04/17 2121    Lab Results: No results found for this or any previous visit (from the past 48 hour(s)).  Blood Alcohol level:  Lab Results  Component Value Date   ETH <10 10/31/2017   ETH <10 10/31/2017    Metabolic Disorder Labs: No results found for: HGBA1C, MPG No results found for: PROLACTIN No results found for: CHOL, TRIG, HDL, CHOLHDL, VLDL, LDLCALC  Physical Findings: AIMS: Facial and Oral Movements Muscles of Facial Expression: None, normal Lips and Perioral Area: None, normal Jaw: None, normal Tongue: None, normal,Extremity Movements Upper (arms, wrists, hands, fingers): None, normal Lower (legs, knees, ankles, toes): None, normal, Trunk Movements Neck, shoulders, hips: None, normal, Overall Severity Severity of abnormal movements (highest score from questions above): None, normal Incapacitation due to abnormal movements: None, normal Patient's awareness of abnormal movements (rate only patient's  report): No Awareness, Dental Status Current problems with teeth and/or dentures?: Yes(absess L jaw/missing teeth) Does patient usually wear dentures?: No  CIWA:    COWS:     Musculoskeletal: Strength & Muscle Tone: within normal limits Gait & Station: normal Patient leans: N/A  Psychiatric Specialty Exam: Physical Exam  Nursing note and vitals reviewed. Constitutional: She is oriented to person, place, and time. She appears well-developed.  HENT:  Head: Normocephalic.  Respiratory: Effort normal.  Neurological: She is alert and oriented to person, place, and time.    Review of Systems  Psychiatric/Behavioral: Positive  for depression and substance abuse. Negative for hallucinations, memory loss and suicidal ideas. The patient is nervous/anxious and has insomnia.   All other systems reviewed and are negative.   Blood pressure 110/77, pulse 73, temperature 98.6 F (37 C), temperature source Oral, resp. rate 16, height 5\' 5"  (1.651 m), weight 63.5 kg, SpO2 100 %.Body mass index is 23.3 kg/m.  General Appearance: Casual  Eye Contact:  Fair  Speech:  Normal Rate  Volume:  Normal  Mood:  Anxious and Depressed mild; with some improvement    Affect:  Congruent  Thought Process:  Coherent and Descriptions of Associations: Intact  Orientation:  Full (Time, Place, and Person)  Thought Content:  Logical  Suicidal Thoughts:  No  Homicidal Thoughts:  No  Memory:  Immediate;   Fair Recent;   Fair Remote;   Fair  Judgement:  Intact  Insight:  Lacking  Psychomotor Activity:  Increased and Restlessness  Concentration:  Concentration: Fair and Attention Span: Fair  Recall:  [employer] of Knowledge:  Fair  Language:  Fair  Akathisia:  Negative  Handed:  Right  AIMS (if indicated):     Assets:  Desire for Improvement Housing Physical Health Resilience  ADL's:  Intact  Cognition:  WNL  Sleep:  Number of Hours: 6     Treatment Plan Summary: Reviewed current treatment plan. Will  continue the following plan with adjustments where noted.   Depressed mood without psychosis- Continues to endorse slow improvement. Continued Zoloft 50 mg po daily.   Insomnia- improving. Continued Trazodone to 100 mg po daily at bedtime as needed.  Nightmares/flasbacks- Stable. Continued prazosin 1 mg p.o. nightly   Pain management- This is a chronic condition. Increased  Gabapentin to  400 mg p.o. 3 times daily for pain as discussed yesterday. Continued  Flexeril 5 mg p.o. 3 times daily for pain.   Anxiety-Slow improvement. Continued  hydroxyzine 25 mg p.o. 3 times daily as needed anxiety  Other:  Safety: Will continue 15 minute observation for safety checks. Patient is able to contract for safety on the unit at this time  Continue to develop treatment plan to decrease risk of relapse upon discharge and to reduce the need for readmission.  Psycho-social education regarding relapse prevention and self care.  Health care follow up as needed for medical problems.  Continue to attend and participate in therapy.   Discharge: working with social work for disposition with regard to substance abuse treatment-discharge planning currently still in process. Referral has been made to [facility] and patient has had a phone interview. CSW will follow-up with [facility] fr bed availability.       [name], NP 11/05/2017, 9:47 AM   Patient ID: Rachel Knapp, female   DOB: Jan 11, 1971, 47 y.o.   MRN: [id number] .Marland KitchenAgree with NP Progress Note

## 2017-11-05 NOTE — Therapy (Signed)
Occupational Therapy Group Note  Date:  11/05/2017 Time:  12:55 PM  Group Topic/Focus:  Stress Management  Participation Level:  Minimal  Participation Quality:  Attentive  Affect:  Flat  Cognitive:  Appropriate  Insight: Lacking  Engagement in Group:  Limited  Modes of Intervention:  Activity, Discussion, Education and Socialization  Additional Comments:    S: "I hold all of my stress in until I explode"  O: Stress management group completed to use as productive coping strategy, to help mitigate maladaptive coping to integrate in functional BADL/IADL when reintegrating into community. Education given on the definition of stress and its cognitive, behavioral, emotional, and physical effects on the body. Stress management tools worksheet completed to identify negative coping mechanisms and their short and long term effects vs positive coping mechanisms with demonstration. Coping strategies taught include: relaxation based- deep breathing, counting to 10, taking a 1 minute vacation, acceptance, stress balls, relaxation audio/video, visual/mental imagery. Positive mental attitude- gratitude, acceptance, cognitive reframing, positive self talk, anger management.   A: Pt presents to group with flat affect, minimally engaged this date. Pt received stress management tools worksheet and education regarding negative vs positive coping strategies. Pt shares that she often suppresses her stress until she "explodes" on herself and others. Pt did not share which coping skill she would like to implement this date, but remained attentive throughout discussion. Pt distracted by peer leaving group upset, became fixated on pt leaving.  P: Pt provided with education on stress management activities to implement into daily routine. Handouts given to facilitate carryover when reintegrating into community   [facility], [place], OTR/L  [employer] 11/05/2017, 12:55 PM

## 2017-11-05 NOTE — [facility] Group Notes (Signed)
[facility] Group Notes:  (Nursing/MHT/Case Management/Adjunct)  Date:  11/05/2017  Time:  4:00 pm  Type of Therapy:  Psychoeducational Skills  Participation Level:  Active  Participation Quality:  Appropriate  Affect:  Appropriate  Cognitive:  Appropriate  Insight:  Appropriate  Engagement in Group:  Engaged  Modes of Intervention:  Education  Summary of Progress/Problems: Patient was alert and participated appropriately in group.    Earline Mayotte 11/05/2017, 5:53 PM

## 2017-11-05 NOTE — Progress Notes (Signed)
Recreation Therapy Notes  Date: 9.4.19 Time: 0930 Location: 300 [address] Dayroom  Group Topic: Stress Management  Goal Area(s) Addresses:  Patient will verbalize importance of using healthy stress management.  Patient will identify positive emotions associated with healthy stress management.   Intervention: Stress Management  Activity :  Guided Imagery.  LRT introduced the stress management technique of guided imagery.  LRT read Knapp script that allowed patients to enjoy the summer clouds.  Patients were to follow along as LRT read script to engage in activity.  Education:  Stress Management, Discharge Planning.   Education Outcome: Acknowledges edcuation/In group clarification offered/Needs additional education  Clinical Observations/Feedback: Pt did not attend group.     Rachel Knapp, LRT/CTRS         Rachel Knapp 11/05/2017 10:59 AM 

## 2017-11-05 NOTE — Plan of Care (Signed)
  Problem: Education: Goal: Emotional status will improve Outcome: Progressing   Problem: Education: Goal: Mental status will improve Outcome: Progressing   Problem: Education: Goal: Verbalization of understanding the information provided will improve Outcome: Progressing   Problem: Activity: Goal: Interest or engagement in activities will improve Outcome: Progressing   Problem: Activity: Goal: Sleeping patterns will improve Outcome: Progressing   

## 2017-11-05 NOTE — Progress Notes (Signed)
Patient self inventory- Patient slept well last night, sleep medication was requested and was helpful. Appetite has been fair, concentration good, energy level low. Depression, hopelessness, and anxiety are rated 6, 6, 8. Denies withdrawal symptoms. Denies SI. Endorses pain in the lower back and neck pain rated 10/10. Patient's goal is "getting my pain under control." Patient is compliant with medications prescribed per provider.  Safety is maintained with 15 minute checks as well as environmental checks. Will continue to monitor.

## 2017-11-05 NOTE — Progress Notes (Signed)
Pt reports she had a better day than yesterday, but she is still not getting relief for her chronic back pain.  She says that the doctor adjusted the frequency of her Flexeril, but says she wants to take the last dose of the day at bedtime.  Pt received the Flexeril with her sleep aid before going to bed.  She voiced no other needs at this time.  She hopes to be able to sleep better tonight.  Pt is appropriate and cooperative.  She makes her needs known to staff.  Support and encouragement offered.  Discharge plans are in process.  Safety maintained with q15 minute checks.

## 2017-11-05 NOTE — [facility] Group Notes (Signed)
LCSW Group Therapy Note 11/05/2017 11:32 AM  Type of Therapy and Topic: Group Therapy: Overcoming Obstacles  Participation Level: Did Not Attend  Description of Group:  In this group patients will be encouraged to explore what they see as obstacles to their own wellness and recovery. They will be guided to discuss their thoughts, feelings, and behaviors related to these obstacles. The group will process together ways to cope with barriers, with attention given to specific choices patients can make. Each patient will be challenged to identify changes they are motivated to make in order to overcome their obstacles. This group will be process-oriented, with patients participating in exploration of their own experiences as well as giving and receiving support and challenge from other group members.  Therapeutic Goals: 1. Patient will identify personal and current obstacles as they relate to admission. 2. Patient will identify barriers that currently interfere with their wellness or overcoming obstacles.  3. Patient will identify feelings, thought process and behaviors related to these barriers. 4. Patient will identify two changes they are willing to make to overcome these obstacles:   Summary of Patient Progress  Invited, chose not to attend.    Therapeutic Modalities:  Cognitive Behavioral Therapy Solution Focused Therapy Motivational Interviewing Relapse Prevention Therapy   Shiesha Jahn LCSWA Clinical Social Worker   

## 2017-11-06 ENCOUNTER — Encounter (HOSPITAL_COMMUNITY): Payer: Self-pay | Admitting: Behavioral Health

## 2017-11-06 DIAGNOSIS — R12 Heartburn: Secondary | ICD-10-CM

## 2017-11-06 MED ORDER — PANTOPRAZOLE SODIUM 40 MG PO TBEC
40.0000 mg | DELAYED_RELEASE_TABLET | Freq: Every day | ORAL | Status: DC
  Administered 2017-11-06 – 2017-11-11 (×6): 40 mg via ORAL
  Filled 2017-11-06 (×7): qty 1

## 2017-11-06 MED ORDER — LIDOCAINE 5 % EX PTCH
1.0000 | MEDICATED_PATCH | CUTANEOUS | Status: DC
  Administered 2017-11-06 – 2017-11-09 (×5): 1 via TRANSDERMAL
  Filled 2017-11-06 (×6): qty 1

## 2017-11-06 NOTE — Progress Notes (Signed)
[facility] Group Notes:  (Nursing/MHT/Case Management/Adjunct)  Date:  11/06/2017  Time:  2100 Type of Therapy:  wrap up group  Participation Level:  Active  Participation Quality:  Appropriate, Attentive, Resistant and Supportive  Affect:  Anxious and Appropriate  Cognitive:  Appropriate  Insight:  Improving  Engagement in Group:  Supportive  Modes of Intervention:  Clarification, Education and Support  Summary of Progress/Problems: Pt reported not feeling depressed today desire to go to [facility] upon discharge. If pt could change any one thing about her life it would be to have never used and pt is grateful for her two children.   Marcille Buffy 11/06/2017, 9:41 PM

## 2017-11-06 NOTE — Progress Notes (Signed)
Pt on unit and in day room.  Pt endorses depression and anxiety and asks for all her prn meds, which were declined".  Pt attends group and participates.  Pt does verbally contract for safety and denies SI, HI or AVH.  Pt continues to have lower back pain and neck pain.  Pt sts pain at times is unbearable.  With a score of 10/10.   Pt is med compliant and remains safe on unit with 15 min checks.

## 2017-11-06 NOTE — Progress Notes (Addendum)
[facility] MD Progress Note  11/06/2017 11:04 AM Rachel Knapp  MRN:  [id number]   Subjective: " As far a mood, I am feeling better. Its just the pain I am dealing with. Nothing is helping and I would like something stronger."  Evaluation on the unit: Face to face evaluation rounded with Clinical research associate and MD, chart reviewed prior to rounding as well as case discussion with treatment team. Patient is a 47 year old female with a past psychiatric history significant for generalized anxiety disorder, major depression, posttraumatic stress disorder and cocaine use disorder.    During this evaluation patient is alert and oriented x4 and cooperative. Patient endorses no concerns with mood and she is reporting overall improvement compared to admission. She remains very focused on her chronic pain (back and leg) and this seem to be  her main concern. We did discuss adding Naproxen to her medications and she was open to that. She denies any SI, HI or AVH. Endorse no concerns with appetite but does report sleeping poorly last night due to pain. Endorse heartburn with a history of reflux.  She remains complaint with medications reporting no side effect or adverse events.There has been no update on [facility] for continued substance abuse treatment following discharge. CSW will continue to work on this disposition. At this time, she is contracting for safety on the unit.         Principal Problem: MDD (major depressive disorder), recurrent severe, without psychosis (HCC) Diagnosis:   Patient Active Problem List   Diagnosis Date Noted  . Cocaine abuse (HCC) [F14.10] 11/02/2017  . MDD (major depressive disorder), recurrent severe, without psychosis (HCC) [F33.2] 11/01/2017   Total Time spent with patient: 20 minutes  Past Psychiatric History: See admission H&P  Past Medical History:  Past Medical History:  Diagnosis Date  . Depression   . Hypertension    History reviewed. No pertinent surgical history. Family  History: History reviewed. No pertinent family history. Family Psychiatric  History: See admission H&P Social History:  Social History   Substance and Sexual Activity  Alcohol Use No     Social History   Substance and Sexual Activity  Drug Use Yes  . Types: Cocaine    Social History   Socioeconomic History  . Marital status: Single    Spouse name: Not on file  . Number of children: Not on file  . Years of education: Not on file  . Highest education level: Not on file  Occupational History  . Not on file  Social Needs  . Financial resource strain: Not on file  . Food insecurity:    Worry: Not on file    Inability: Not on file  . Transportation needs:    Medical: Not on file    Non-medical: Not on file  Tobacco Use  . Smoking status: Current Every Day Smoker    Packs/day: 0.50    Types: Cigarettes  . Smokeless tobacco: Never Used  Substance and Sexual Activity  . Alcohol use: No  . Drug use: Yes    Types: Cocaine  . Sexual activity: Not on file  Lifestyle  . Physical activity:    Days per week: Not on file    Minutes per session: Not on file  . Stress: Not on file  Relationships  . Social connections:    Talks on phone: Not on file    Gets together: Not on file    Attends religious service: Not on file    Active member of  club or organization: Not on file    Attends meetings of clubs or organizations: Not on file    Relationship status: Not on file  Other Topics Concern  . Not on file  Social History Narrative  . Not on file   Additional Social History:       Sleep: partially imrpvoed. Most sleep disurbances are due to ongoing chronic pain   Appetite:  Good  Current Medications: Current Facility-Administered Medications  Medication Dose Route Frequency Provider Last Rate Last Dose  . acetaminophen (TYLENOL) tablet 650 mg  650 mg Oral Q6H PRN Money, [name], FNP   650 mg at 11/05/17 2137  . alum & mag hydroxide-simeth (MAALOX/MYLANTA) 200-200-20  MG/5ML suspension 30 mL  30 mL Oral Q4H PRN Money, [name], FNP      . cyclobenzaprine (FLEXERIL) tablet 5 mg  5 mg Oral TID Tria Noguera, Rockey Situ, MD   5 mg at 11/06/17 0739  . feeding supplement (ENSURE ENLIVE) (ENSURE ENLIVE) liquid 237 mL  237 mL Oral BID BM [name], MD   237 mL at 11/05/17 1721  . gabapentin (NEURONTIN) capsule 400 mg  400 mg Oral TID [name], NP   400 mg at 11/06/17 0739  . hydrOXYzine (ATARAX/VISTARIL) tablet 25 mg  25 mg Oral TID PRN Money, [name], FNP   25 mg at 11/05/17 2138  . magnesium hydroxide (MILK OF MAGNESIA) suspension 30 mL  30 mL Oral Daily PRN Money, [name], FNP      . neomycin-bacitracin-polymyxin (NEOSPORIN) ointment   Topical TID PRN [name], NP      . nicotine (NICODERM CQ - dosed in mg/24 hours) patch 21 mg  21 mg Transdermal Daily [name], MD      . prazosin (MINIPRESS) capsule 1 mg  1 mg Oral QHS [name], MD   1 mg at 11/05/17 2139  . sertraline (ZOLOFT) tablet 50 mg  50 mg Oral Daily [name], MD   50 mg at 11/06/17 0739  . traZODone (DESYREL) tablet 100 mg  100 mg Oral QHS PRN Bree Heinzelman, Rockey Situ, MD   100 mg at 11/05/17 2138    Lab Results: No results found for this or any previous visit (from the past 48 hour(s)).  Blood Alcohol level:  Lab Results  Component Value Date   ETH <10 10/31/2017   ETH <10 10/31/2017    Metabolic Disorder Labs: No results found for: HGBA1C, MPG No results found for: PROLACTIN No results found for: CHOL, TRIG, HDL, CHOLHDL, VLDL, LDLCALC  Physical Findings: AIMS: Facial and Oral Movements Muscles of Facial Expression: None, normal Lips and Perioral Area: None, normal Jaw: None, normal Tongue: None, normal,Extremity Movements Upper (arms, wrists, hands, fingers): None, normal Lower (legs, knees, ankles, toes): None, normal, Trunk Movements Neck, shoulders, hips: None, normal, Overall Severity Severity of abnormal movements (highest score from  questions above): None, normal Incapacitation due to abnormal movements: None, normal Patient's awareness of abnormal movements (rate only patient's report): No Awareness, Dental Status Current problems with teeth and/or dentures?: Yes Does patient usually wear dentures?: No  CIWA:    COWS:     Musculoskeletal: Strength & Muscle Tone: within normal limits Gait & Station: normal Patient leans: N/A  Psychiatric Specialty Exam: Physical Exam  Nursing note and vitals reviewed. Constitutional: She is oriented to person, place, and time. She appears well-developed.  HENT:  Head: Normocephalic.  Respiratory: Effort normal.  Neurological: She is alert and  oriented to person, place, and time.    Review of Systems  Psychiatric/Behavioral: Positive for depression and substance abuse. Negative for hallucinations, memory loss and suicidal ideas. The patient is nervous/anxious and has insomnia.   All other systems reviewed and are negative.   Blood pressure 110/77, pulse 73, temperature 98.6 F (37 C), temperature source Oral, resp. rate 16, height 5\' 5"  (1.651 m), weight 63.5 kg, SpO2 100 %.Body mass index is 23.3 kg/m.  General Appearance: Casual  Eye Contact:  Fair  Speech:  Normal Rate  Volume:  Normal  Mood:   improvement in mood, depression      Affect:  Congruent  Thought Process:  Coherent and Descriptions of Associations: Intact  Orientation:  Full (Time, Place, and Person)  Thought Content:  Logical  Suicidal Thoughts:  No  Homicidal Thoughts:  No  Memory:  Immediate;   Fair Recent;   Fair Remote;   Fair  Judgement:  Intact  Insight:  Lacking  Psychomotor Activity:  Increased and Restlessness  Concentration:  Concentration: Fair and Attention Span: Fair  Recall:  [employer] of Knowledge:  Fair  Language:  Fair  Akathisia:  Negative  Handed:  Right  AIMS (if indicated):     Assets:  Desire for Improvement Housing Physical Health Resilience  ADL's:  Intact   Cognition:  WNL  Sleep:  Number of Hours: 6.75     Treatment Plan Summary: Reviewed current treatment plan. Will continue the following plan with adjustments where noted.   Depressed mood without psychosis- Continues to endorse slow improvement. Continued Zoloft 50 mg po daily.   Insomnia- Seems to be fluctuating but mostly due to ongoing chronic pain. Continued Trazodone to 100 mg po daily at bedtime as needed.  Nightmares/flasbacks- Stable. Continued prazosin 1 mg p.o. nightly   Pain management- This is a chronic condition. Continued  Gabapentin to  400 mg p.o. 3 times daily for pain as discussed yesterday. Continued  Flexeril 5 mg p.o. 3 times daily for pain. Discussed adding Naprosyn and patient agreed however, as per chart review, patient has allergies to this medication. Will discuss this with patient to see if this is a true allergy.  Heartburn-Ordered Protonix 40 mg po daily.   Anxiety-Slow improvement. Continued  hydroxyzine 25 mg p.o. 3 times daily as needed anxiety  Other:  Safety: Will continue 15 minute observation for safety checks. Patient is able to contract for safety on the unit at this time  Continue to develop treatment plan to decrease risk of relapse upon discharge and to reduce the need for readmission.  Psycho-social education regarding relapse prevention and self care.  Health care follow up as needed for medical problems.  Continue to attend and participate in therapy.   Discharge: working with social work for disposition with regard to substance abuse treatment-discharge planning currently still in process. Awaiting follow-up from [facility]. CSW will follow-up and provide updates when available.       [name], NP 11/06/2017, 11:04 AM   Patient ID: Rachel Knapp, female   DOB: 07-22-1970, 47 y.o.   MRN: [id number] .Marland KitchenAgree with NP Progress Note

## 2017-11-06 NOTE — Progress Notes (Signed)
Pt received PRN pain medication for back/leg pain.

## 2017-11-06 NOTE — Plan of Care (Signed)
  Problem: Education: Goal: Mental status will improve Outcome: Progressing   Problem: Education: Goal: Verbalization of understanding the information provided will improve Outcome: Progressing   Problem: Activity: Goal: Interest or engagement in activities will improve Outcome: Progressing   Problem: Activity: Goal: Sleeping patterns will improve Outcome: Progressing   

## 2017-11-06 NOTE — Progress Notes (Signed)
Pt reports that she has had problems with her back for two years. She rates her pain as an 8 on 0-10 scale. A lidocaine patch was placed on her back as ordered by MD and both a cold and warm pack were given for discomfort. Pt refused her evening flexeril and requested that the last dose be given at HS. She is currently in the dayroom interacting with peers. Safety maintained on the unit.

## 2017-11-07 MED ORDER — TRAZODONE HCL 100 MG PO TABS
100.0000 mg | ORAL_TABLET | Freq: Every evening | ORAL | Status: DC | PRN
  Administered 2017-11-07 – 2017-11-08 (×4): 100 mg via ORAL
  Filled 2017-11-07 (×8): qty 1

## 2017-11-07 MED ORDER — SERTRALINE HCL 100 MG PO TABS
100.0000 mg | ORAL_TABLET | Freq: Every day | ORAL | Status: DC
  Administered 2017-11-08 – 2017-11-11 (×4): 100 mg via ORAL
  Filled 2017-11-07 (×5): qty 1

## 2017-11-07 NOTE — Plan of Care (Signed)
  Problem: Education: Goal: Verbalization of understanding the information provided will improve Outcome: Progressing   

## 2017-11-07 NOTE — Progress Notes (Signed)
Patient ID: Rachel Knapp, female   DOB: 07/13/1970, 47 y.o.   MRN: [id number] D: Patient observed watching TV and interacting well with peers on approach. Pt c/o pain without relieve from earlier treatment. Pt rates depression as 0 and anxiety as 8 on 0-10 scale. Denies  SI/HI/AVH.No behavioral issues noted.  A: Support and encouragement offered as needed to express needs. Medications administered as prescribed.  R: Patient is safe and cooperative on unit. Will continue to monitor  for safety and stability.
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
## 2017-11-07 NOTE — Progress Notes (Signed)
D: Pt was in dayroom upon initial approach.  Pt presents with anxious affect and mood.  She describes her day as "so so."  Her primary concern is related to her back pain and she discusses how she plans to address this with provider tomorrow.  Her goal is to "sleep better than I did last night."  Pt denies SI/HI, denies hallucinations, reports chronic back pain of 10/10.  Pt has been visible in milieu interacting with peers and staff appropriately.    A: Introduced self to pt.  Actively listened to pt and offered support and encouragement. Medications administered per order.  PRN medication administered for pain and anxiety.  Heat packs provided for pain.  Q15 minute safety checks maintained.  R: Pt is safe on the unit.  Pt is compliant with medications.  Pt verbally contracts for safety.  Will continue to monitor and assess.

## 2017-11-07 NOTE — Progress Notes (Addendum)
[facility] MD Progress Note  11/07/2017 3:32 PM Rachel Knapp  MRN:  [id number]   Subjective: "I am not doing a good today.  I cannot sleep and I am still in pain.."  Evaluation on the unit: Face to face evaluation rounded with Clinical research associate and MD, chart reviewed prior to rounding as well as case discussion with treatment team. Patient is a 47 year old female with a past psychiatric history significant for generalized anxiety disorder, major depression, posttraumatic stress disorder and cocaine use disorder.    During this evaluation patient is alert and oriented x4 and cooperative.  Patient is observed lying in her bed, although cooperative patient appears irritable at this time.  She endorses trouble sleeping, however discussed with patient the importance of limiting daytime sleep.  Patient did not appear interested in additional sleep hygiene habits.  At this time she is currently taking trazodone 100 mg p.o. nightly as needed.  "I used to take Seroquel and then help me sleep a while ago only able to start that medication. ?"  At this time patient also remains on Zoloft 50 mg p.o. daily for depression and anxiety.  Patient previously placed on lidocaine patch for her chronic back pain however advised that this does not work either.  Patient is encouraged to remain mobile increase physical activity to help decrease pain.  She denies any SI, HI or AVH. Endorse no concerns with appetite but does report sleeping poorly last night due to pain. Endorse heartburn with a history of reflux.  She remains complaint with medications reporting no side effect or adverse events.There has been no update on [facility] for continued substance abuse treatment following discharge. CSW will continue to work on this disposition. At this time, she is contracting for safety on the unit.      Principal Problem: MDD (major depressive disorder), recurrent severe, without psychosis (HCC) Diagnosis:   Patient Active Problem List   Diagnosis  Date Noted  . Cocaine abuse (HCC) [F14.10] 11/02/2017  . MDD (major depressive disorder), recurrent severe, without psychosis (HCC) [F33.2] 11/01/2017   Total Time spent with patient: 20 minutes  Past Psychiatric History: See admission H&P  Past Medical History:  Past Medical History:  Diagnosis Date  . Depression   . Hypertension    History reviewed. No pertinent surgical history. Family History: History reviewed. No pertinent family history. Family Psychiatric  History: See admission H&P Social History:  Social History   Substance and Sexual Activity  Alcohol Use No     Social History   Substance and Sexual Activity  Drug Use Yes  . Types: Cocaine    Social History   Socioeconomic History  . Marital status: Single    Spouse name: Not on file  . Number of children: Not on file  . Years of education: Not on file  . Highest education level: Not on file  Occupational History  . Not on file  Social Needs  . Financial resource strain: Not on file  . Food insecurity:    Worry: Not on file    Inability: Not on file  . Transportation needs:    Medical: Not on file    Non-medical: Not on file  Tobacco Use  . Smoking status: Current Every Day Smoker    Packs/day: 0.50    Types: Cigarettes  . Smokeless tobacco: Never Used  Substance and Sexual Activity  . Alcohol use: No  . Drug use: Yes    Types: Cocaine  . Sexual activity: Not on  file  Lifestyle  . Physical activity:    Days per week: Not on file    Minutes per session: Not on file  . Stress: Not on file  Relationships  . Social connections:    Talks on phone: Not on file    Gets together: Not on file    Attends religious service: Not on file    Active member of club or organization: Not on file    Attends meetings of clubs or organizations: Not on file    Relationship status: Not on file  Other Topics Concern  . Not on file  Social History Narrative  . Not on file   Additional Social History:        Sleep: partially imrpvoed. Most sleep disurbances are due to ongoing chronic pain   Appetite:  Good  Current Medications: Current Facility-Administered Medications  Medication Dose Route Frequency Provider Last Rate Last Dose  . acetaminophen (TYLENOL) tablet 650 mg  650 mg Oral Q6H PRN Money, [name], FNP   650 mg at 11/06/17 1205  . alum & mag hydroxide-simeth (MAALOX/MYLANTA) 200-200-20 MG/5ML suspension 30 mL  30 mL Oral Q4H PRN Money, [name], FNP   30 mL at 11/06/17 1206  . cyclobenzaprine (FLEXERIL) tablet 5 mg  5 mg Oral TID Skye Rodarte, Rockey Situ, MD   5 mg at 11/07/17 1310  . feeding supplement (ENSURE ENLIVE) (ENSURE ENLIVE) liquid 237 mL  237 mL Oral BID BM [name], MD   237 mL at 11/06/17 1647  . gabapentin (NEURONTIN) capsule 400 mg  400 mg Oral TID [name], NP   400 mg at 11/07/17 1310  . hydrOXYzine (ATARAX/VISTARIL) tablet 25 mg  25 mg Oral TID PRN Money, [name], FNP   25 mg at 11/06/17 2123  . lidocaine (LIDODERM) 5 % 1 patch  1 patch Transdermal Q24H Shandricka Monroy, Rockey Situ, MD   1 patch at 11/06/17 1814  . magnesium hydroxide (MILK OF MAGNESIA) suspension 30 mL  30 mL Oral Daily PRN Money, [name], FNP      . neomycin-bacitracin-polymyxin (NEOSPORIN) ointment   Topical TID PRN [name], NP      . nicotine (NICODERM CQ - dosed in mg/24 hours) patch 21 mg  21 mg Transdermal Daily [name], MD      . pantoprazole (PROTONIX) EC tablet 40 mg  40 mg Oral Daily [name], NP   40 mg at 11/07/17 3086  . prazosin (MINIPRESS) capsule 1 mg  1 mg Oral QHS [name], MD   1 mg at 11/06/17 2123  . sertraline (ZOLOFT) tablet 50 mg  50 mg Oral Daily [name], MD   50 mg at 11/07/17 5784  . traZODone (DESYREL) tablet 100 mg  100 mg Oral QHS PRN Nastashia Gallo, Rockey Situ, MD   100 mg at 11/06/17 2123    Lab Results: No results found for this or any previous visit (from the past 48 hour(s)).  Blood Alcohol level:  Lab Results  Component  Value Date   ETH <10 10/31/2017   ETH <10 10/31/2017    Metabolic Disorder Labs: No results found for: HGBA1C, MPG No results found for: PROLACTIN No results found for: CHOL, TRIG, HDL, CHOLHDL, VLDL, LDLCALC  Physical Findings: AIMS: Facial and Oral Movements Muscles of Facial Expression: None, normal Lips and Perioral Area: None, normal Jaw: None, normal Tongue: None, normal,Extremity Movements Upper (arms, wrists, hands, fingers): None, normal Lower (legs, knees, ankles, toes):  None, normal, Trunk Movements Neck, shoulders, hips: None, normal, Overall Severity Severity of abnormal movements (highest score from questions above): None, normal Incapacitation due to abnormal movements: None, normal Patient's awareness of abnormal movements (rate only patient's report): No Awareness, Dental Status Current problems with teeth and/or dentures?: Yes Does patient usually wear dentures?: No  CIWA:    COWS:     Musculoskeletal: Strength & Muscle Tone: within normal limits Gait & Station: normal Patient leans: N/A  Psychiatric Specialty Exam: Physical Exam  Nursing note and vitals reviewed. Constitutional: She is oriented to person, place, and time. She appears well-developed.  HENT:  Head: Normocephalic.  Respiratory: Effort normal.  Neurological: She is alert and oriented to person, place, and time.    Review of Systems  Psychiatric/Behavioral: Positive for depression and substance abuse. Negative for hallucinations, memory loss and suicidal ideas. The patient is nervous/anxious and has insomnia.   All other systems reviewed and are negative.   Blood pressure 121/85, pulse 80, temperature 98 F (36.7 C), temperature source Oral, resp. rate 18, height 5\' 5"  (1.651 m), weight 63.5 kg, SpO2 100 %.Body mass index is 23.3 kg/m.  General Appearance: Casual  Eye Contact:  Fair  Speech:  Normal Rate  Volume:  Normal  Mood:   improvement in mood, depression      Affect:   Congruent  Thought Process:  Coherent and Descriptions of Associations: Intact  Orientation:  Full (Time, Place, and Person)  Thought Content:  Logical  Suicidal Thoughts:  No  Homicidal Thoughts:  No  Memory:  Immediate;   Fair Recent;   Fair Remote;   Fair  Judgement:  Intact  Insight:  Lacking  Psychomotor Activity:  Increased and Restlessness  Concentration:  Concentration: Fair and Attention Span: Fair  Recall:  [employer] of Knowledge:  Fair  Language:  Fair  Akathisia:  Negative  Handed:  Right  AIMS (if indicated):     Assets:  Desire for Improvement Housing Physical Health Resilience  ADL's:  Intact  Cognition:  WNL  Sleep:  Number of Hours: 6.25     Treatment Plan Summary: Reviewed current treatment plan. Will continue the following plan with adjustments where noted.   Depressed mood without psychosis- Continues to endorse slow improvement.  Increase Zoloft to 100 mg p.o. Daily.  Insomnia- Seems to be fluctuating but mostly due to ongoing chronic pain. Continued Trazodone to 100 mg po daily at bedtime as needed May repeat x1.   Nightmares/flasbacks- Stable. Continued prazosin 1 mg p.o. nightly   Pain management- This is a chronic condition. Continued  Gabapentin to  400 mg p.o. 3 times daily for pain as discussed yesterday. Continued  Flexeril 5 mg p.o. 3 times daily for pain. Discussed adding Naprosyn and patient agreed however, as per chart review, patient has allergies to this medication. Will discuss this with patient to see if this is a true allergy.  Heartburn-Ordered Protonix 40 mg po daily.   Anxiety-Slow improvement. Continued  hydroxyzine 25 mg p.o. 3 times daily as needed anxiety  Other:  Safety: Will continue 15 minute observation for safety checks. Patient is able to contract for safety on the unit at this time  Continue to develop treatment plan to decrease risk of relapse upon discharge and to reduce the need for readmission.  Psycho-social  education regarding relapse prevention and self care.  Health care follow up as needed for medical problems.  Continue to attend and participate in therapy.   Discharge: working  with social work for disposition with regard to substance abuse treatment-discharge planning currently still in process. Awaiting follow-up from [facility]. CSW will follow-up and provide updates when available.       Truman Hayward, FNP 11/07/2017, 3:32 PM   Patient ID: Rachel Knapp, female   DOB: [birth year], 47 y.o.   MRN: [id number] .Marland KitchenAgree with NP Progress Note

## 2017-11-08 NOTE — Progress Notes (Signed)
D Pt is observed by writer standing in the 300 hall group room this am- after returning to the adult unit from eating breakfast in the cafe'. She has a frown on her face. She brightens when this Clinical research associate addresses her and says " at least I finally got some sleep last night". She is appropriately dressed. Her affect is depressed, flat and irritable.      A She did complete her daily assessment this morning. She denied having SI today and she rated her depression, hopelessness and anxiety " 0/0/3", respectively. She finds it difficult to identify something positive today and is not able to identify a goal either. She spoke with NP about her rheumatoid arthritis and  how she needs to move and walk AMAP , to decrease the stiffness in her joints.     R Safety is in place

## 2017-11-08 NOTE — [facility] Group Notes (Signed)
[facility] Group Notes:  (Nursing/MHT/Case Management/Adjunct)  Date:  11/08/2017  Time:  1:15 PM  Type of Therapy:  Nurse Education  Participation Level:  Active  Participation Quality:  Appropriate  Affect:  Appropriate  Cognitive:  Appropriate  Insight:  Appropriate  Engagement in Group:  Engaged  Modes of Intervention:  Discussion and Education  Summary of Progress/Problems: Nurse led group: Life Skills/Identifying Needs  Raylene Miyamoto 11/08/2017, 3:29 PM

## 2017-11-08 NOTE — [facility] Group Notes (Signed)
LCSW Group Therapy Note  11/08/2017    10:00-11:00am   Type of Therapy and Topic:  Group Therapy: Anger and Coping Skills  Participation Level:  Active   Description of Group:   In this group, patients learned how to recognize the physical, cognitive, emotional, and behavioral responses they have to anger-provoking situations.  They identified how they usually or often react when angered, and learned how healthy and unhealthy coping skills work initially, but the unhealthy ones stop working.   They analyzed how their frequently-chosen coping skill is possibly beneficial and how it is possibly unhelpful.  The group discussed a variety of healthier coping skills that could help in resolving the actual issues, as well as how to go about planning for the the possibility of future similar situations.  Therapeutic Goals: 1. Patients will identify one thing that makes them angry and how they feel emotionally and physically, what their thoughts are or tend to be in those situations, and what healthy or unhealthy coping mechanism they typically use 2. Patients will identify how their coping technique works for them, as well as how it works against them. 3. Patients will explore possible new behaviors to use in future anger situations. 4. Patients will learn that anger itself is normal and cannot be eliminated, and that healthier coping skills can assist with resolving conflict rather than worsening situations.  Summary of Patient Progress:  The patient shared that she was "irritated and angry" last night when several different things were happening, and she did not want to discuss them.  She stated that she wanted to punch a wall, and instead she chose to sit by herself to "bring it down a notch or two" then rejoined the group in the day room.  Therapeutic Modalities:   Cognitive Behavioral Therapy Motivation Interviewing  [name]  .

## 2017-11-08 NOTE — Plan of Care (Signed)
  Problem: Activity: Goal: Sleeping patterns will improve Outcome: Progressing   

## 2017-11-08 NOTE — Progress Notes (Signed)
D: Pt was in dayroom upon initial approach.  Pt presents with anxious affect and mood.  She describes her day as "pretty good" and reports her "brain has been racing a lot" which causes difficulty sleeping.  Her goal is to "get the racing thoughts under control."   Pt denies SI/HI, denies hallucinations, reports chronic back pain of 9/10.  Pt has been visible in milieu interacting with peers and staff appropriately.  Pt attended evening group.  She reports she had a good visit with her children and her mother today.    A: Introduced self to pt.  Actively listened to pt and offered support and encouragement. Medications administered per order.  PRN medication offered for pain, pt declined.  Heat packs provided for pain.  Q15 minute safety checks maintained.  R: Pt is safe on the unit.  Pt is compliant with medications.  Pt verbally contracts for safety.  Will continue to monitor and assess.

## 2017-11-08 NOTE — Progress Notes (Addendum)
[facility] MD Progress Note  11/08/2017 9:55 AM Rachel Knapp  MRN:  [id number]   Subjective: "I slept better last night."  Evaluation on the unit: Face to face evaluation rounded with writer and MD, chart reviewed prior to rounding as well as case discussion with treatment team. Patient is a 47 year old female with a past psychiatric history significant for generalized anxiety disorder, major depression, posttraumatic stress disorder and cocaine use disorder.    During this evaluation patient is alert and oriented x4 and cooperative.  Again patient is lying in bed, during group time. She reports being able to sleep much better as a result of this medications. She is calm and cooperative, and engages well with Clinical research associate. She states she is attending groups at this time. 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 She is informed that remaining active and increasing her physical mobility will help decrease her pain. She is tolerating her medication well to include increase of Zoloft 100mg  po daily for depression.   She denies any SI, HI or AVH.  Endorse heartburn with a history of reflux.  She remains complaint with medications reporting no side effect or adverse events. Patient is advised to participate in treatment plan and seek additional housing options, current length of stay is 7 days. She has completed her detox and is stable at this time for discharge. She denies any symptoms for depression, anxiety, hopelessness, or suicidal thoughts. At this time, she is contracting for safety on the unit.   Principal Problem: MDD (major depressive disorder), recurrent severe, without psychosis (HCC) Diagnosis:   Patient Active Problem List   Diagnosis Date Noted  . Cocaine abuse (HCC) [F14.10] 11/02/2017  . MDD (major depressive disorder), recurrent severe, without psychosis (HCC) [F33.2] 11/01/2017   Total Time spent with patient: 20 minutes  Past Psychiatric History: See  admission H&P  Past Medical History:  Past Medical History:  Diagnosis Date  . Depression   . Hypertension    History reviewed. No pertinent surgical history. Family History: History reviewed. No pertinent family history. Family Psychiatric  History: See admission H&P Social History:  Social History   Substance and Sexual Activity  Alcohol Use No     Social History   Substance and Sexual Activity  Drug Use Yes  . Types: Cocaine    Social History   Socioeconomic History  . Marital status: Single    Spouse name: Not on file  . Number of children: Not on file  . Years of education: Not on file  . Highest education level: Not on file  Occupational History  . Not on file  Social Needs  . Financial resource strain: Not on file  . Food insecurity:    Worry: Not on file    Inability: Not on file  . Transportation needs:    Medical: Not on file    Non-medical: Not on file  Tobacco Use  . Smoking status: Current Every Day Smoker    Packs/day: 0.50    Types: Cigarettes  . Smokeless tobacco: Never Used  Substance and Sexual Activity  . Alcohol use: No  . Drug use: Yes    Types: Cocaine  . Sexual activity: Not on file  Lifestyle  . Physical activity:    Days per week: Not on file    Minutes per session: Not on file  . Stress: Not on file  Relationships  . Social connections:    Talks on phone: Not on file  Gets together: Not on file    Attends religious service: Not on file    Active member of club or organization: Not on file    Attends meetings of clubs or organizations: Not on file    Relationship status: Not on file  Other Topics Concern  . Not on file  Social History Narrative  . Not on file   Additional Social History:    Sleep: Fair, much better   Appetite:  Good  Current Medications: Current Facility-Administered Medications  Medication Dose Route Frequency Provider Last Rate Last Dose  . acetaminophen (TYLENOL) tablet 650 mg  650 mg Oral Q6H  PRN Money, [name], FNP   650 mg at 11/07/17 1943  . alum & mag hydroxide-simeth (MAALOX/MYLANTA) 200-200-20 MG/5ML suspension 30 mL  30 mL Oral Q4H PRN Money, [name], FNP   30 mL at 11/06/17 1206  . cyclobenzaprine (FLEXERIL) tablet 5 mg  5 mg Oral TID Cobos, Rockey Situ, MD   5 mg at 11/08/17 0803  . feeding supplement (ENSURE ENLIVE) (ENSURE ENLIVE) liquid 237 mL  237 mL Oral BID BM [name], MD   237 mL at 11/06/17 1647  . gabapentin (NEURONTIN) capsule 400 mg  400 mg Oral TID [name], NP   400 mg at 11/08/17 0803  . hydrOXYzine (ATARAX/VISTARIL) tablet 25 mg  25 mg Oral TID PRN Money, [name], FNP   25 mg at 11/07/17 1943  . lidocaine (LIDODERM) 5 % 1 patch  1 patch Transdermal Q24H Cobos, Rockey Situ, MD   1 patch at 11/08/17 0803  . magnesium hydroxide (MILK OF MAGNESIA) suspension 30 mL  30 mL Oral Daily PRN Money, [name], FNP      . neomycin-bacitracin-polymyxin (NEOSPORIN) ointment   Topical TID PRN [name], NP      . pantoprazole (PROTONIX) EC tablet 40 mg  40 mg Oral Daily [name], NP   40 mg at 11/08/17 0803  . prazosin (MINIPRESS) capsule 1 mg  1 mg Oral QHS [name], MD   1 mg at 11/07/17 2110  . sertraline (ZOLOFT) tablet 100 mg  100 mg Oral Daily Truman Hayward, FNP   100 mg at 11/08/17 0803  . traZODone (DESYREL) tablet 100 mg  100 mg Oral QHS,MR X 1 Starkes, Takia S, FNP   100 mg at 11/07/17 2204    Lab Results: No results found for this or any previous visit (from the past 48 hour(s)).  Blood Alcohol level:  Lab Results  Component Value Date   ETH <10 10/31/2017   ETH <10 10/31/2017    Metabolic Disorder Labs: No results found for: HGBA1C, MPG No results found for: PROLACTIN No results found for: CHOL, TRIG, HDL, CHOLHDL, VLDL, LDLCALC  Physical Findings: AIMS: Facial and Oral Movements Muscles of Facial Expression: None, normal Lips and Perioral Area: None, normal Jaw: None, normal Tongue: None,  normal,Extremity Movements Upper (arms, wrists, hands, fingers): None, normal Lower (legs, knees, ankles, toes): None, normal, Trunk Movements Neck, shoulders, hips: None, normal, Overall Severity Severity of abnormal movements (highest score from questions above): None, normal Incapacitation due to abnormal movements: None, normal Patient's awareness of abnormal movements (rate only patient's report): No Awareness, Dental Status Current problems with teeth and/or dentures?: Yes Does patient usually wear dentures?: No  CIWA:    COWS:     Musculoskeletal: Strength & Muscle Tone: within normal limits Gait & Station: normal Patient leans: N/A  Psychiatric Specialty Exam: Physical  Exam  Nursing note and vitals reviewed. Constitutional: She is oriented to person, place, and time. She appears well-developed.  HENT:  Head: Normocephalic.  Respiratory: Effort normal.  Neurological: She is alert and oriented to person, place, and time.    Review of Systems  Psychiatric/Behavioral: Positive for depression and substance abuse. Negative for hallucinations, memory loss and suicidal ideas. The patient is nervous/anxious and has insomnia.   All other systems reviewed and are negative.   Blood pressure 114/72, pulse 80, temperature 98.1 F (36.7 C), temperature source Oral, resp. rate 16, height 5\' 5"  (1.651 m), weight 63.5 kg, SpO2 100 %.Body mass index is 23.3 kg/m.  General Appearance: Casual  Eye Contact:  Fair  Speech:  Normal Rate  Volume:  Normal  Mood:   improvement in mood, depression      Affect:  Appropriate and Congruent  Thought Process:  Coherent, Linear and Descriptions of Associations: Intact  Orientation:  Full (Time, Place, and Person)  Thought Content:  WDL  Suicidal Thoughts:  No  Homicidal Thoughts:  No  Memory:  Immediate;   Fair Recent;   Fair Remote;   Fair  Judgement:  Intact  Insight:  Present  Psychomotor Activity:  Normal  Concentration:  Concentration:  Fair and Attention Span: Fair  Recall:  [employer] of Knowledge:  Fair  Language:  Fair  Akathisia:  Negative  Handed:  Right  AIMS (if indicated):     Assets:  Desire for Improvement Housing Physical Health Resilience  ADL's:  Intact  Cognition:  WNL  Sleep:  Number of Hours: 6.75     Treatment Plan Summary: Reviewed current treatment plan. Will continue the following plan with adjustments where noted.   Depressed mood without psychosis- Continues to endorse slow improvement.  Increase Zoloft to 100 mg p.o. Daily.  Insomnia- Seems to be fluctuating but mostly due to ongoing chronic pain. Continued Trazodone to 100 mg po daily at bedtime as needed May repeat x1.   Nightmares/flasbacks- Stable. Continued prazosin 1 mg p.o. nightly   Pain management- This is a chronic condition. Continued  Gabapentin to  400 mg p.o. 3 times daily for pain as discussed yesterday. Continued  Flexeril 5 mg p.o. 3 times daily for pain. Discussed adding Naprosyn and patient agreed however, as per chart review, patient has allergies to this medication. Will discuss this with patient to see if this is a true allergy.  Heartburn-Ordered Protonix 40 mg po daily.   Anxiety-Slow improvement. Continued  hydroxyzine 25 mg p.o. 3 times daily as needed anxiety  Other:  Safety: Will continue 15 minute observation for safety checks. Patient is able to contract for safety on the unit at this time  Continue to develop treatment plan to decrease risk of relapse upon discharge and to reduce the need for readmission.  Psycho-social education regarding relapse prevention and self care.  Health care follow up as needed for medical problems.  Continue to attend and participate in therapy.   Discharge: working with social work for disposition with regard to substance abuse treatment-discharge planning currently still in process. Awaiting follow-up from [facility]. CSW will follow-up and provide updates when available.        Truman Hayward, FNP 11/08/2017, 9:55 AM   Patient ID: Rachel Knapp, female   DOB: Aug 12, 1970, 47 y.o.   MRN: [id number] .Marland KitchenAgree with NP Progress Note

## 2017-11-09 MED ORDER — CYCLOBENZAPRINE HCL 10 MG PO TABS
5.0000 mg | ORAL_TABLET | Freq: Three times a day (TID) | ORAL | Status: DC | PRN
  Administered 2017-11-09 – 2017-11-11 (×5): 5 mg via ORAL
  Filled 2017-11-09 (×4): qty 1

## 2017-11-09 MED ORDER — QUETIAPINE FUMARATE 50 MG PO TABS
50.0000 mg | ORAL_TABLET | Freq: Every day | ORAL | Status: DC
  Filled 2017-11-09 (×2): qty 1

## 2017-11-09 MED ORDER — QUETIAPINE FUMARATE 25 MG PO TABS
25.0000 mg | ORAL_TABLET | Freq: Every day | ORAL | Status: DC
  Administered 2017-11-09: 25 mg via ORAL
  Filled 2017-11-09 (×2): qty 1

## 2017-11-09 NOTE — Progress Notes (Signed)
D: Pt was in dayroom upon initial approach.  Pt presents with irritable, anxious affect and anxious mood.  She reports poor sleep last night despite having 6.75 hours of sleep per flowsheet.  Her goal is to "sleep better."  Pt denies SI/HI, denies hallucinations, reports chronic back pain of 10/10.  Pt has been visible in milieu interacting with peers appropriately.  She has been argumentative with select staff at times, requiring redirection.    A: Introduced self to pt.  Actively listened to pt and offered support and encouragement. Medications administered per order.  PRN medication administered for anxiety and muscle spasms.  Pt was encouraged to turn the lights off in her room to try to get some sleep but she did not do so.  Pt continued to talk to loudly talk to her roommate and did not appear to be trying to sleep.  Q15 minute safety checks maintained.  R: Pt is safe on the unit.  Pt is compliant with medications.  Pt verbally contracts for safety.  Will continue to monitor and assess.

## 2017-11-09 NOTE — Progress Notes (Signed)
Patient ID: Rachel Knapp, female   DOB: Nov 25, 1970, 47 y.o.   MRN: [id number]  D: Pt with blunted affect reports a poor sleep quality, states that she was "up all night", educated on the need to let staff know at night if she is unable to sleep. Pt reports that her appetite is fair, reports a low energy level, and a good concentration level.  Pt rates her depression today as 3 (10 being the worst), denies feeling hopeless, and rates her anxiety level as 8 (10 being the worst).  Pt denies SI/HI/AVH, rated her back pain earlier in shift as 9/10, and was medicated with Tylenol 650mg .  Pt complained of racing thoughts, and was hopeful that the provider would be able to give her more medications to help with this.  A: Pt given all of her meds as scheduled, and is being maintained on Q15 minute checks for safety.  R: Will continue to monitor, and address concerns as they arise.

## 2017-11-09 NOTE — [facility] Group Notes (Signed)
[facility] LCSW Group Therapy Note  11/09/2017  10:00-11:00AM  Type of Therapy and Topic:  Group Therapy:  Being Your Own Support  Participation Level:  Did Not Attend   Description of Group:  Patients in this group were introduced to the concept that self-support is an essential part of recovery.  A song entitled "My Own Hero" was played and a group discussion ensued in which patients stated they could relate to the song and it inspired them to realize they have be willing to help themselves in order to succeed, because other people cannot achieve sobriety or stability for them.  We discussed adding a variety of healthy supports to address the various needs in their lives.  A song was played called "I Know Where I've Been" toward the end of group and used to conduct an inspirational wrap-up to group of remembering how far they have already come in their journey.  Therapeutic Goals: 1)  demonstrate the importance of being a part of one's own support system 2)  discuss reasons people in one's life may eventually be unable to be continually supportive  3)  identify the patient's current support system and   4)  elicit commitments to add healthy supports and to become more conscious of being self-supportive   Summary of Patient Progress:  N/A   Therapeutic Modalities:   Motivational Interviewing Activity  Burnell Hurta J Grossman-Orr       

## 2017-11-09 NOTE — Progress Notes (Signed)
[facility] MD Progress Note  11/09/2017 12:52 PM Rachel Knapp  MRN:  [id number]   Subjective: patient reports she is feeling partially better than on admission. Currently denies suicidal ideations. Denies medication side effects.  Describes chronic pain, affecting mostly her back, which she feels has also contributed to depression. Acknowledges pain has subsided partially compared to admission, but still present. States she felt better yesterday after she was visited by family members. Reports intermittent cravings for cocaine, expresses motivation in treatment and is wanting to go to a rehab setting at discharge. Describes persistent insomnia as an ongoing concern.  Objective : I have reviewed chart notes and have met with patient. 47 year old female, presented due to depression, suicidal ideations, cocaine abuse .  At this time reports partially improved mood, denies suicidal ideations. Affect remains vaguely dysphoric, irritable but improves partially during session. Denies medication side effects. As above, patient reports persistent insomnia, and " racing thoughts " keeping her from being able to fall asleep. States she does not feel Trazodone is working for her. Reports that Seroquel did help in the past, and does not remember having had side effects. Denies medication side effects Principal Problem: MDD (major depressive disorder), recurrent severe, without psychosis ([place]) Diagnosis:   Patient Active Problem List   Diagnosis Date Noted  . Cocaine abuse ([place]) [F14.10] 11/02/2017  . MDD (major depressive disorder), recurrent severe, without psychosis ([place]) [F33.2] 11/01/2017   Total Time spent with patient: 20 minutes  Past Psychiatric History: See admission H&P  Past Medical History:  Past Medical History:  Diagnosis Date  . Depression   . Hypertension    History reviewed. No pertinent surgical history. Family History: History reviewed. No pertinent family history. Family  Psychiatric  History: See admission H&P Social History:  Social History   Substance and Sexual Activity  Alcohol Use No     Social History   Substance and Sexual Activity  Drug Use Yes  . Types: Cocaine    Social History   Socioeconomic History  . Marital status: Single    Spouse name: Not on file  . Number of children: Not on file  . Years of education: Not on file  . Highest education level: Not on file  Occupational History  . Not on file  Social Needs  . Financial resource strain: Not on file  . Food insecurity:    Worry: Not on file    Inability: Not on file  . Transportation needs:    Medical: Not on file    Non-medical: Not on file  Tobacco Use  . Smoking status: Current Every Day Smoker    Packs/day: 0.50    Types: Cigarettes  . Smokeless tobacco: Never Used  Substance and Sexual Activity  . Alcohol use: No  . Drug use: Yes    Types: Cocaine  . Sexual activity: Not on file  Lifestyle  . Physical activity:    Days per week: Not on file    Minutes per session: Not on file  . Stress: Not on file  Relationships  . Social connections:    Talks on phone: Not on file    Gets together: Not on file    Attends religious service: Not on file    Active member of club or organization: Not on file    Attends meetings of clubs or organizations: Not on file    Relationship status: Not on file  Other Topics Concern  . Not on file  Social History  Narrative  . Not on file   Additional Social History:    Sleep: Fair  Appetite:  Good  Current Medications: Current Facility-Administered Medications  Medication Dose Route Frequency Provider Last Rate Last Dose  . acetaminophen (TYLENOL) tablet 650 mg  650 mg Oral Q6H PRN Money, Lowry Ram, FNP   650 mg at 11/09/17 0759  . alum & mag hydroxide-simeth (MAALOX/MYLANTA) 200-200-20 MG/5ML suspension 30 mL  30 mL Oral Q4H PRN Money, Darnelle Maffucci B, FNP   30 mL at 11/06/17 1206  . cyclobenzaprine (FLEXERIL) tablet 5 mg  5 mg  Oral TID Shrihan Putt, Myer Peer, MD   5 mg at 11/09/17 1209  . feeding supplement (ENSURE ENLIVE) (ENSURE ENLIVE) liquid 237 mL  237 mL Oral BID BM Sharma Covert, MD   Stopped at 11/08/17 1830  . gabapentin (NEURONTIN) capsule 400 mg  400 mg Oral TID Mordecai Maes, NP   400 mg at 11/09/17 1209  . hydrOXYzine (ATARAX/VISTARIL) tablet 25 mg  25 mg Oral TID PRN Money, Lowry Ram, FNP   25 mg at 11/09/17 0759  . lidocaine (LIDODERM) 5 % 1 patch  1 patch Transdermal Q24H Carlin Mamone, Myer Peer, MD   1 patch at 11/08/17 1816  . magnesium hydroxide (MILK OF MAGNESIA) suspension 30 mL  30 mL Oral Daily PRN Money, Lowry Ram, FNP      . pantoprazole (PROTONIX) EC tablet 40 mg  40 mg Oral Daily Mordecai Maes, NP   40 mg at 11/09/17 0755  . prazosin (MINIPRESS) capsule 1 mg  1 mg Oral QHS Sharma Covert, MD   1 mg at 11/08/17 2107  . QUEtiapine (SEROQUEL) tablet 50 mg  50 mg Oral QHS Shellby Schlink A, MD      . sertraline (ZOLOFT) tablet 100 mg  100 mg Oral Daily Priscille Loveless S, FNP   100 mg at 11/09/17 5027    Lab Results: No results found for this or any previous visit (from the past 48 hour(s)).  Blood Alcohol level:  Lab Results  Component Value Date   ETH <10 10/31/2017   ETH <10 74/02/8785    Metabolic Disorder Labs: No results found for: HGBA1C, MPG No results found for: PROLACTIN No results found for: CHOL, TRIG, HDL, CHOLHDL, VLDL, LDLCALC  Physical Findings: AIMS: Facial and Oral Movements Muscles of Facial Expression: None, normal Lips and Perioral Area: None, normal Jaw: None, normal Tongue: None, normal,Extremity Movements Upper (arms, wrists, hands, fingers): None, normal Lower (legs, knees, ankles, toes): None, normal, Trunk Movements Neck, shoulders, hips: None, normal, Overall Severity Severity of abnormal movements (highest score from questions above): None, normal Incapacitation due to abnormal movements: None, normal Patient's awareness of abnormal movements (rate  only patient's report): No Awareness, Dental Status Current problems with teeth and/or dentures?: No Does patient usually wear dentures?: No  CIWA:    COWS:     Musculoskeletal: Strength & Muscle Tone: within normal limits Gait & Station: normal Patient leans: N/A  Psychiatric Specialty Exam: Physical Exam  Nursing note and vitals reviewed. Constitutional: She is oriented to person, place, and time. She appears well-developed.  HENT:  Head: Normocephalic.  Respiratory: Effort normal.  Neurological: She is alert and oriented to person, place, and time.    Review of Systems  Psychiatric/Behavioral: Positive for depression and substance abuse. Negative for hallucinations, memory loss and suicidal ideas. The patient is nervous/anxious and has insomnia.   All other systems reviewed and are negative. no chest pain, no dyspnea, no vomiting,  chronic back pain  Blood pressure 108/74, pulse 87, temperature 98.2 F (36.8 C), temperature source Oral, resp. rate 16, height _0  (1.651 m), weight 63.5 kg, SpO2 100 %.Body mass index is 23.3 kg/m.  General Appearance: Fairly Groomed  Eye Contact:  Good  Speech:  Normal Rate  Volume:  Normal  Mood:  reports partially improved mood    Affect:  remains constricted,vaguely irritable, but improves during session, smiles at times appropriately  Thought Process:  Linear and Descriptions of Associations: Intact  Orientation:  Full (Time, Place, and Person)  Thought Content:  no hallucinations, no delusions, not internally preoccupied   Suicidal Thoughts:  No- denies suicidal ideations, contracts for safety on unit   Homicidal Thoughts:  No  Memory:  recent and remote grossly intact   Judgement:  Other:  fair- improving   Insight:  improving   Psychomotor Activity:  improving   Concentration:  Concentration: Good and Attention Span: Good  Recall:  Good  Fund of Knowledge:  Good  Language:  Good  Akathisia:  Negative  Handed:  Right  AIMS (if  indicated):     Assets:  Desire for Improvement Housing Physical Health Resilience  ADL's:  Intact  Cognition:  WNL  Sleep:  Number of Hours: 6.75    Assessment - 47 year old female, presented for depression, suicidal ideations, cocaine use disorder . Reports partially improved mood, but remains vaguely constricted and dysphoric, which she attributes to chronic back pain but mostly to insomnia. States Trazodone has not improved her sleep noticeably and states Seroquel has been helpful and well tolerated in the past . Denies suicidal ideations and is future oriented, wanting to go to a rehab setting at discharge  Treatment Plan Encourage milieu, group participation Encourage efforts to work on sobriety and relapse prevention Treatment team working on disposition planning- interested in going to rehab ( [place]) .  Continue Zoloft 100 mgrs QDAY for depression Start Seroquel 25 mgrs QHS for insomnia and for antidepressant augmentation (titrate gradually as tolerated) Continue Minipress 1 mgr QHS for nightmares  Continue Neurontin 400 mgrs TID for anxiety and pain Continue Flexeril 5 mgr TID PRN for muscular pain as needed  Continue Lidoderm patch for back pain        Rachel Campus, MD 11/09/2017, 12:52 PM   Patient ID: Quita Skye, female   DOB: 1970-06-13, 47 y.o.   MRN: [id number]

## 2017-11-10 MED ORDER — QUETIAPINE FUMARATE 50 MG PO TABS
50.0000 mg | ORAL_TABLET | Freq: Every day | ORAL | Status: DC
  Administered 2017-11-10: 50 mg via ORAL
  Filled 2017-11-10 (×3): qty 1

## 2017-11-10 MED ORDER — LIDOCAINE 5 % EX PTCH
2.0000 | MEDICATED_PATCH | Freq: Every day | CUTANEOUS | Status: DC
  Administered 2017-11-10: 2 via TRANSDERMAL
  Filled 2017-11-10 (×2): qty 2

## 2017-11-10 NOTE — Progress Notes (Signed)
[facility] Group Notes:  (Nursing/MHT/Case Management/Adjunct)  Date:  11/10/2017  Time: 2045  Type of Therapy:  wrap up group  Participation Level:  Active  Participation Quality:  Appropriate, Attentive, Sharing and Supportive  Affect:  Appropriate  Cognitive:  Appropriate  Insight:  Improving  Engagement in Group:  Engaged  Modes of Intervention:  Clarification, Education and Support  Summary of Progress/Problems: Pt shared that she is looking forward to starting a new medication to help her sleep better tonight. If pt could change any one thing it would be never picking up a drug.  Pt is grateful for her family and 3 children.   Marcille Buffy 11/10/2017, 9:55 PM

## 2017-11-10 NOTE — Plan of Care (Signed)
  Problem: Education: Goal: Mental status will improve Outcome: Progressing   Problem: Education: Goal: Verbalization of understanding the information provided will improve Outcome: Progressing   Problem: Activity: Goal: Interest or engagement in activities will improve Outcome: Progressing   Problem: Activity: Goal: Sleeping patterns will improve Outcome: Progressing   Problem: Coping: Goal: Ability to verbalize frustrations and anger appropriately will improve Outcome: Progressing

## 2017-11-10 NOTE — Progress Notes (Signed)
[facility] MD Progress Note  11/10/2017 11:24 AM Rachel Knapp  MRN:  [id number]   Subjective: patient reports she is feeling partially better than on admission. Currently denies suicidal ideations. Denies medication side effects.  Describes chronic pain, affecting mostly her back, which she feels has also contributed to depression. Acknowledges pain has subsided partially compared to admission, but still present. States she felt better yesterday after she was visited by family members. Reports intermittent cravings for cocaine, expresses motivation in treatment and is wanting to go to a rehab setting at discharge. Describes persistent insomnia as an ongoing concern. Recently started on Seroquel but reports that 25 mg was not an effective dose.   Objective : I have reviewed chart notes and have met with patient. 47 year old female, presented due to depression, suicidal ideations, cocaine abuse .  At this time reports partially improved mood, denies suicidal ideations. Affect remains vaguely dysphoric,  but improves partially during session. Denies medication side effects. As above, patient reports persistent insomnia, and " racing thoughts " keeping her from being able to fall asleep. States she does not feel Trazodone is working for her. Reports that Seroquel did help in the past, and does not remember having had side effects. Denies medication side effects Principal Problem: MDD (major depressive disorder), recurrent severe, without psychosis ([place]) Diagnosis:   Patient Active Problem List   Diagnosis Date Noted  . Cocaine abuse ([place]) [F14.10] 11/02/2017  . MDD (major depressive disorder), recurrent severe, without psychosis ([place]) [F33.2] 11/01/2017   Total Time spent with patient: 20 minutes  Past Psychiatric History: See admission H&P  Past Medical History:  Past Medical History:  Diagnosis Date  . Depression   . Hypertension    History reviewed. No pertinent surgical history. Family  History: History reviewed. No pertinent family history. Family Psychiatric  History: See admission H&P Social History:  Social History   Substance and Sexual Activity  Alcohol Use No     Social History   Substance and Sexual Activity  Drug Use Yes  . Types: Cocaine    Social History   Socioeconomic History  . Marital status: Single    Spouse name: Not on file  . Number of children: Not on file  . Years of education: Not on file  . Highest education level: Not on file  Occupational History  . Not on file  Social Needs  . Financial resource strain: Not on file  . Food insecurity:    Worry: Not on file    Inability: Not on file  . Transportation needs:    Medical: Not on file    Non-medical: Not on file  Tobacco Use  . Smoking status: Current Every Day Smoker    Packs/day: 0.50    Types: Cigarettes  . Smokeless tobacco: Never Used  Substance and Sexual Activity  . Alcohol use: No  . Drug use: Yes    Types: Cocaine  . Sexual activity: Not on file  Lifestyle  . Physical activity:    Days per week: Not on file    Minutes per session: Not on file  . Stress: Not on file  Relationships  . Social connections:    Talks on phone: Not on file    Gets together: Not on file    Attends religious service: Not on file    Active member of club or organization: Not on file    Attends meetings of clubs or organizations: Not on file    Relationship status:  Not on file  Other Topics Concern  . Not on file  Social History Narrative  . Not on file   Additional Social History:    Sleep: Poor  Appetite:  Good  Current Medications: Current Facility-Administered Medications  Medication Dose Route Frequency Provider Last Rate Last Dose  . acetaminophen (TYLENOL) tablet 650 mg  650 mg Oral Q6H PRN Money, Lowry Ram, FNP   650 mg at 11/09/17 1708  . alum & mag hydroxide-simeth (MAALOX/MYLANTA) 200-200-20 MG/5ML suspension 30 mL  30 mL Oral Q4H PRN Money, Darnelle Maffucci B, FNP   30 mL at  11/06/17 1206  . cyclobenzaprine (FLEXERIL) tablet 5 mg  5 mg Oral TID PRN Cobos, Myer Peer, MD   5 mg at 11/10/17 0815  . feeding supplement (ENSURE ENLIVE) (ENSURE ENLIVE) liquid 237 mL  237 mL Oral BID BM Sharma Covert, MD   Stopped at 11/08/17 1830  . gabapentin (NEURONTIN) capsule 400 mg  400 mg Oral TID Mordecai Maes, NP   400 mg at 11/10/17 6387  . hydrOXYzine (ATARAX/VISTARIL) tablet 25 mg  25 mg Oral TID PRN Money, Lowry Ram, FNP   25 mg at 11/09/17 2056  . lidocaine (LIDODERM) 5 % 1 patch  1 patch Transdermal Q24H Cobos, Myer Peer, MD   1 patch at 11/09/17 1706  . magnesium hydroxide (MILK OF MAGNESIA) suspension 30 mL  30 mL Oral Daily PRN Money, Lowry Ram, FNP      . pantoprazole (PROTONIX) EC tablet 40 mg  40 mg Oral Daily Mordecai Maes, NP   40 mg at 11/10/17 5643  . prazosin (MINIPRESS) capsule 1 mg  1 mg Oral QHS Sharma Covert, MD   1 mg at 11/09/17 2056  . QUEtiapine (SEROQUEL) tablet 50 mg  50 mg Oral QHS Elmarie Shiley A, NP      . sertraline (ZOLOFT) tablet 100 mg  100 mg Oral Daily Priscille Loveless S, FNP   100 mg at 11/10/17 3295    Lab Results: No results found for this or any previous visit (from the past 48 hour(s)).  Blood Alcohol level:  Lab Results  Component Value Date   ETH <10 10/31/2017   ETH <10 18/84/1660    Metabolic Disorder Labs: No results found for: HGBA1C, MPG No results found for: PROLACTIN No results found for: CHOL, TRIG, HDL, CHOLHDL, VLDL, LDLCALC  Physical Findings: AIMS: Facial and Oral Movements Muscles of Facial Expression: None, normal Lips and Perioral Area: None, normal Jaw: None, normal Tongue: None, normal,Extremity Movements Upper (arms, wrists, hands, fingers): None, normal Lower (legs, knees, ankles, toes): None, normal, Trunk Movements Neck, shoulders, hips: None, normal, Overall Severity Severity of abnormal movements (highest score from questions above): None, normal Incapacitation due to abnormal movements:  None, normal Patient's awareness of abnormal movements (rate only patient's report): No Awareness, Dental Status Current problems with teeth and/or dentures?: No Does patient usually wear dentures?: No  CIWA:    COWS:     Musculoskeletal: Strength & Muscle Tone: within normal limits Gait & Station: normal Patient leans: N/A  Psychiatric Specialty Exam: Physical Exam  Nursing note and vitals reviewed. Constitutional: She is oriented to person, place, and time. She appears well-developed.  HENT:  Head: Normocephalic.  Respiratory: Effort normal.  Neurological: She is alert and oriented to person, place, and time.    Review of Systems  Musculoskeletal: Positive for joint pain.  Psychiatric/Behavioral: Positive for depression and substance abuse. Negative for hallucinations, memory loss and suicidal ideas. The  patient is nervous/anxious and has insomnia.   All other systems reviewed and are negative. no chest pain, no dyspnea, no vomiting, chronic back pain  Blood pressure 130/79, pulse 89, temperature 98.2 F (36.8 C), temperature source Oral, resp. rate 16, height _0  (1.651 m), weight 63.5 kg, SpO2 100 %.Body mass index is 23.3 kg/m.  General Appearance: Fairly Groomed  Eye Contact:  Good  Speech:  Normal Rate  Volume:  Normal  Mood:  reports partially improved mood    Affect:  Constricted  Thought Process:  Linear and Descriptions of Associations: Intact  Orientation:  Full (Time, Place, and Person)  Thought Content:  no hallucinations, no delusions, not internally preoccupied   Suicidal Thoughts:  No- denies suicidal ideations, contracts for safety on unit   Homicidal Thoughts:  No  Memory:  recent and remote grossly intact   Judgement:  Other:  fair- improving   Insight:  improving   Psychomotor Activity:  improving   Concentration:  Concentration: Good and Attention Span: Good  Recall:  Good  Fund of Knowledge:  Good  Language:  Good  Akathisia:  Negative   Handed:  Right  AIMS (if indicated):     Assets:  Desire for Improvement Housing Physical Health Resilience  ADL's:  Intact  Cognition:  WNL  Sleep:  Number of Hours: 6.75    Assessment - 47 year old female, presented for depression, suicidal ideations, cocaine use disorder . Reports partially improved mood, but remains vaguely constricted and dysphoric, which she attributes to chronic back pain but mostly to insomnia. States Trazodone has not improved her sleep noticeably and states Seroquel has been helpful and well tolerated in the past . Denies suicidal ideations and is future oriented, wanting to go to a rehab setting at discharge  Treatment Plan Encourage milieu, group participation Encourage efforts to work on sobriety and relapse prevention Treatment team working on disposition planning- interested in going to rehab ( [place]) .  Continue Zoloft 100 mgrs QDAY for depression Increase Seroquel 50 mgrs QHS for insomnia and for antidepressant augmentation (titrate gradually as tolerated) Continue Minipress 1 mgr QHS for nightmares  Continue Neurontin 400 mgrs TID for anxiety and pain Continue Flexeril 5 mgr TID PRN for muscular pain as needed  Continue Lidoderm patch for back pain  Elmarie Shiley, NP 11/10/2017, 11:24 AM   Patient ID: Rachel Knapp, female   DOB: [birth year], 47 y.o.   MRN: [id number]

## 2017-11-10 NOTE — Progress Notes (Signed)
Recreation Therapy Notes  Date: 9.9.19 Time: 0930 Location: [address] Dayroom  Group Topic: Stress Management  Goal Area(s) Addresses:  Patient will verbalize importance of using healthy stress management.  Patient will identify positive emotions associated with healthy stress management.   Intervention: Stress Management  Activity :  [place] Meditation.  LRT introduced the stress management technique of meditation.  Patients were to listen as meditation played to engage in the activity.  Education: Stress Management, Discharge Planning.   Education Outcome: Acknowledges edcuation/In group clarification offered/Needs additional education  Clinical Observations/Feedback: Pt did not attend group.    Denicia Pagliarulo, LRT/CTRS        Samantha Olivera A 11/10/2017 12:10 PM 

## 2017-11-10 NOTE — Progress Notes (Signed)
Patient self inventory- Patient slept poor last night, sleep medication was taken but is not helpful. Appetite has been good, energy level low, concentration good. Depression, hopelessness, and anxiety are rated 0, 0, and 5. Endorses cravings. Denies SI HI AVH. Endorses pain level 10/10 in her back. Patient's goal is "getting my sleep meds right."  Patient was compliant with medication administration. Safety is maintained with 15 minute checks as well as environmental checks. Will continue to monitor.

## 2017-11-10 NOTE — Plan of Care (Signed)
  Problem: Safety: Goal: Periods of time without injury will increase Outcome: Progressing Note:  Pt has not harmed self or others tonight.  She denies SI/HI and verbally contracts for safety.    

## 2017-11-10 NOTE — Progress Notes (Signed)
Patient has been accepted to [facility] in [place] for residential SA treatment.   A representative from [facility] will transport the patient to their facility, tomorrow 11/11/17 at 12:00pm.  Patient must discharge with a 21-day supply of medications.   CSW will notify the patient's MD and RN staff.    Baldo Daub, MSW, LCSWA Clinical Social Worker [facility]  Phone: [phone]

## 2017-11-11 MED ORDER — GABAPENTIN 400 MG PO CAPS: 400.0000 mg | ORAL_CAPSULE | Freq: Three times a day (TID) | ORAL | 0 refills | Status: DC

## 2017-11-11 MED ORDER — CYCLOBENZAPRINE HCL 5 MG PO TABS: 5.0000 mg | ORAL_TABLET | Freq: Three times a day (TID) | ORAL | 0 refills | Status: DC | PRN

## 2017-11-11 MED ORDER — HYDROXYZINE HCL 25 MG PO TABS: 25.0000 mg | ORAL_TABLET | Freq: Three times a day (TID) | ORAL | 0 refills | Status: DC | PRN

## 2017-11-11 MED ORDER — QUETIAPINE FUMARATE 50 MG PO TABS: 50.0000 mg | ORAL_TABLET | Freq: Every day | ORAL | 0 refills | Status: DC

## 2017-11-11 MED ORDER — PRAZOSIN HCL 1 MG PO CAPS: 1.0000 mg | ORAL_CAPSULE | Freq: Every day | ORAL | 0 refills | Status: DC

## 2017-11-11 MED ORDER — PANTOPRAZOLE SODIUM 40 MG PO TBEC: 40.0000 mg | DELAYED_RELEASE_TABLET | Freq: Every day | ORAL | 0 refills | Status: DC

## 2017-11-11 MED ORDER — SERTRALINE HCL 100 MG PO TABS: 100.0000 mg | ORAL_TABLET | Freq: Every day | ORAL | 0 refills | Status: DC
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
## 2017-11-11 NOTE — Progress Notes (Signed)
Patient ID: Rachel Knapp, female   DOB: [birth year], 47 y.o.   MRN: [id number] D: Patient sitting in dayroom watching TV and interacting with peers. Pt reports she is not sleeping and rated her back pain as 10 on 0-10 scale.  Pt appeared frustrated about not getting the pain medication she requested. Denies  SI/HI/AVH. Pt attended evening wrap up group and engaged in discussions.  A: Support and encouragement offered as needed to express needs. Medications administered as prescribed.  R: Patient is safe and cooperative on unit. Will continue to monitor  for safety and stability.

## 2017-11-11 NOTE — BHH Suicide Risk Assessment (Signed)
[facility] Discharge Suicide Risk Assessment   Principal Problem: MDD (major depressive disorder), recurrent severe, without psychosis (HCC) Discharge Diagnoses:  Patient Active Problem List   Diagnosis Date Noted  . Cocaine abuse (HCC) [F14.10] 11/02/2017  . MDD (major depressive disorder), recurrent severe, without psychosis (HCC) [F33.2] 11/01/2017    Total Time spent with patient: 15 minutes  Musculoskeletal: Strength & Muscle Tone: within normal limits Gait & Station: normal Patient leans: N/A  Psychiatric Specialty Exam: Review of Systems  All other systems reviewed and are negative.   Blood pressure 116/77, pulse 83, temperature 98 F (36.7 C), temperature source Oral, resp. rate 16, height 5\' 5"  (1.651 m), weight 63.5 kg, SpO2 100 %.Body mass index is 23.3 kg/m.  General Appearance: Casual  Eye Contact::  Fair  Speech:  Clear and Coherent409  Volume:  Normal  Mood:  Euthymic  Affect:  Congruent  Thought Process:  Coherent and Descriptions of Associations: Intact  Orientation:  Full (Time, Place, and Person)  Thought Content:  Logical  Suicidal Thoughts:  No  Homicidal Thoughts:  No  Memory:  Immediate;   Fair Recent;   Fair Remote;   Fair  Judgement:  Intact  Insight:  Lacking  Psychomotor Activity:  Normal  Concentration:  Good  Recall:  Good  Fund of Knowledge:Fair  Language: Fair  Akathisia:  Negative  Handed:  Right  AIMS (if indicated):     Assets:  Housing Physical Health Resilience  Sleep:  Number of Hours: 5.25  Cognition: WNL  ADL's:  Intact   Mental Status Per Nursing Assessment::   On Admission:  Suicidal ideation indicated by patient, Self-harm thoughts, Intention to act on suicide plan  Demographic Factors:  Divorced or widowed, Caucasian, Low socioeconomic status and Unemployed  Loss Factors: NA  Historical Factors: Impulsivity  Risk Reduction Factors:   Responsible for children under 8 years of age, Sense of responsibility to  family and Living with another person, especially a relative  Continued Clinical Symptoms:  Alcohol/Substance Abuse/Dependencies  Cognitive Features That Contribute To Risk:  None    Suicide Risk:  Minimal: No identifiable suicidal ideation.  Patients presenting with no risk factors but with morbid ruminations; may be classified as minimal risk based on the severity of the depressive symptoms  Follow-up Information    [facility] Follow up.   Specialty:  Addiction Medicine Why:  Accepted for treatment 11/11/17 at 12:00pm.  Contact information: [address] [place] [address] [phone]        [facility] Follow up.   Specialty:  Behavioral Health Why:  Walk-in hours are Monday-Friday from 8:00am-5:00pm. Please bring the following if you have it: Photo ID, social security card, any proof of income, and hospital discharge paperwork. Thank you. Contact information: [address] [place] [address] [phone]           Plan Of Care/Follow-up recommendations:  Activity:  ad lib  [name], MD 11/11/2017, 8:56 AM

## 2017-11-11 NOTE — Progress Notes (Signed)
  [facility] Adult Case Management Discharge Plan :  Will you be returning to the same living situation after discharge:  Yes,  home At discharge, do you have transportation home?: Yes,  bus pass provided per pt request. Do you have the ability to pay for your medications: Yes,  mental health  Release of information consent forms completed and submitted to medical records by CSW.   Patient to Follow up at: Follow-up Information    [facility] Follow up.   Specialty:  Addiction Medicine Why:  Accepted for treatment 11/11/17 at 12:00pm. Patient has declined bed as of 8am on 9/10. If you decide to pursue treatment in the future at [facility], please call Shayla in admissions to check bed availability. Thank you.  Contact information: [address] [place] [address] [phone]        [facility] Follow up.   Specialty:  Behavioral Health Why:  Walk-in hours are Monday-Friday from 8:00am-5:00pm. Please bring the following if you have it: Photo ID, social security card, any proof of income, and hospital discharge paperwork. Thank you. Contact information: [address] [place] [address] [phone]           Next level of care provider has access to [facility] Link:no  Safety Planning and Suicide Prevention discussed: Yes,  SPE completed with pt's mother and with pt. SPI pamphlet and Mobile Crisis information provided.   Have you used any form of tobacco in the last 30 days? (Cigarettes, Smokeless Tobacco, Cigars, and/or Pipes): Yes  Has patient been referred to the Quitline?: Patient refused referral  Patient has been referred for addiction treatment: Yes  Rona Ravens, LCSW 11/11/2017, 11:01 AM

## 2017-11-11 NOTE — Progress Notes (Signed)
Pt received both written and verbal discharge instructions. Pt verbalized understanding of discharge instructions. Pt agreed to f/u appt and med regimen. Pt received d/c packet, prescriptions and sample meds. Pt gathered belongings from room and locker. Pt requested a bus pass for transportation home. Pt provided with a bus pass from CSW. Pt safely discharged to the lobby.

## 2017-11-17 MED FILL — PANTOPRAZOLE SOD DR 40 MG T: 40 | 15 days supply | Qty: 15 | Fill #0

## 2017-11-17 MED FILL — QUETIAPINE FUMARATE 50 MG T: 50 | 30 days supply | Qty: 30 | Fill #0

## 2017-11-17 MED FILL — PRAZOSIN 1 MG CAPSULE: 1 | 30 days supply | Qty: 30 | Fill #0

## 2017-11-17 MED FILL — GABAPENTIN 400 MG CAPSULE: 400 | 30 days supply | Qty: 90 | Fill #0

## 2017-11-17 MED FILL — SERTRALINE HCL 100 MG TAB: 100 | 30 days supply | Qty: 30 | Fill #0

## 2017-11-17 MED FILL — hydrOXYzine HCL 25 MG TABS: 25 | 20 days supply | Qty: 60 | Fill #0

## 2020-09-21 ENCOUNTER — Ambulatory Visit (INDEPENDENT_AMBULATORY_CARE_PROVIDER_SITE_OTHER): Payer: Self-pay | Admitting: *Deleted

## 2020-09-21 NOTE — Telephone Encounter (Signed)
Pt called the number for [facility] that was on her new insurance card.   The agent made her an appt to be established with Gwinda Passe, NP.   Pt was transferred to me for triage after mentioning her hands were numb.  Her hands have been numb for over 8 years.   She used to be on gabapentin and other medications for arthritis and the numbness years ago.   "I haven't been to a doctor in many years".   "Now that I got my Medicaid I'm trying to get set up with a new doctor".    She has an appt on Sept 9, 2022 at 1:50 to be established.    I suggested she go to an urgent care if if was something she could not wait that long for.   She also mentioned that she's had arthritis real bad in her hands for many years as well as the numbness.   The only new symptom is her grip is getting weaker.  She did not mention if she would go to urgent care of not.   She thanked me for my help and "I'm just glad to get set up with a doctor again".

## 2020-09-21 NOTE — Telephone Encounter (Signed)
Reason for Disposition  [1] Numbness or tingling in one or both feet AND [2] is a chronic symptom (recurrent or ongoing AND present > 4 weeks)  Answer Assessment - Initial Assessment Questions 1. SYMPTOM: "What is the main symptom you are concerned about?" (e.g., weakness, numbness)     Both hands numb and I can't hardly grip things.    Some mornings I can't hardly walk.    I have real bad arthritis.    My hands feel like they are asleep.   My knuckles swell up and I can't wear my rings sometimes. 2. ONSET: "When did this start?" (minutes, hours, days; while sleeping)     Over 8 years ago.   I got approved for Medicaid recently.   My hands have gotten worse. 3. LAST NORMAL: "When was the last time you (the patient) were normal (no symptoms)?"     Over 8 yrs ago.   I haven't been to a doctor.in years.     4. PATTERN "Does this come and go, or has it been constant since it started?"  "Is it present now?"     Constant and getting worse and my grip is weaker. 5. CARDIAC SYMPTOMS: "Have you had any of the following symptoms: chest pain, difficulty breathing, palpitations?"     No heart problems.   6. NEUROLOGIC SYMPTOMS: "Have you had any of the following symptoms: headache, dizziness, vision loss, double vision, changes in speech, unsteady on your feet?"     I have migraines with vomiting at times.     7. OTHER SYMPTOMS: "Do you have any other symptoms?"     My sinuses are real congested.   My eyes water.  My covid test was negative 2 weeks ago 8. PREGNANCY: "Is there any chance you are pregnant?" "When was your last menstrual period?"     Not asked  Protocols used: Neurologic Deficit-A-AH

## 2020-11-30 ENCOUNTER — Ambulatory Visit (INDEPENDENT_AMBULATORY_CARE_PROVIDER_SITE_OTHER): Payer: Self-pay | Admitting: Primary Care

## 2022-06-10 NOTE — H&P (Signed)
   Patient: Shekira Kratzer  PID: [id number]  DOB: 04-04-1970  SEX: Female   Patient referred by DDS for extraction teeth  CC:  Painful teeth.  Past Medical History:  High Blood Pressure, Smoker    Medications: Lisinopril    Allergies:     NKDA    Surgeries:   no surgeries     Social History       Smoking:  1/2 ppd          Alcohol:denies Drug use:        denies                     Exam: BMI  25. Gross decay and bone loss all remaining teeth. Grossly mobile #20. No purulence, edema, fluctuance, trismus. Oral cancer screening negative. Pharynx clear. No lymphadenopathy.  Panorex:Gross decay and bone loss all remaining teeth # 6, 7, 8, 10, 11, 12, 13, 14, 15, 20, 22, 23, 24, 25, 26, 27, 28, 29, 30, 31. Grossly mobile #20.  Assessment: ASA . Non-restorable  teeth # 6, 7, 8, 10, 11, 12, 13, 14, 15, 20, 22, 23, 24, 25, 26, 27, 28, 29, 30, 31.              Plan: Extraction Teeth # 6, 7, 8, 10, 11, 12, 13, 14, 15, 20, 22, 23, 24, 25, 26, 27, 28, 29, 30, 31. Alveoloplasty.  Hospital Day surgery.                 Rx: Motrin 800, Amoxicillin               Risks and complications explained. Questions answered.   Georgia Lopes, DMD

## 2022-06-12 ENCOUNTER — Other Ambulatory Visit: Payer: Self-pay

## 2022-06-12 ENCOUNTER — Encounter (HOSPITAL_COMMUNITY): Payer: Self-pay | Admitting: Oral Surgery

## 2022-06-12 NOTE — Progress Notes (Signed)
SDW call  Patient was given pre-op instructions over the phone. Patient verbalized understanding of instructions provided.     PCP - Dr Wynelle Link Cardiologist - Denies Pulmonary: Denies   PPM/ICD - Denies   Chest x-ray - 10/31/2017 EKG -  n/a Stress Test - ECHO -  Cardiac Cath -   Sleep Study/sleep apnea/CPAP: Denies  Non-diabetic  Blood Thinner Instructions: Denies Aspirin Instructions:Denies   ERAS Protcol - No, NPO PRE-SURGERY Ensure or G2- No   COVID TEST- n/a    Anesthesia review: No   Patient denies shortness of breath, fever, cough and chest pain over the phone call    Your procedure is scheduled on Friday, June 14, 2022  Report to [facility] Main Entrance "A" at 0615   A.M., then check in with the Admitting office.  Call this number if you have problems the morning of surgery:  [phone]   If you have any questions prior to your surgery date call [phone]: Open Monday-Friday 8am-4pm If you experience any cold or flu symptoms such as cough, fever, chills, shortness of breath, etc. between now and your scheduled surgery, please notify us at the above number     Remember:  Do not eat or drink after midnight the night before your surgery   Take these medicines the morning of surgery with A SIP OF WATER:  Gabapentin, protonix.    As needed: Flexeril, hydroxyzine  As of today, STOP taking any Aspirin (unless otherwise instructed by your surgeon) Aleve, Naproxen, Ibuprofen, Motrin, Advil, Goody's, BC's, all herbal medications, fish oil, and all vitamins.

## 2022-06-14 ENCOUNTER — Encounter (HOSPITAL_COMMUNITY)
Admission: AD | Disposition: A | Discharge status: Home / Self Care | Payer: Self-pay | Source: Home / Self Care | Attending: Oral Surgery

## 2022-06-14 ENCOUNTER — Ambulatory Visit (HOSPITAL_COMMUNITY)
Admission: AD | Admit: 2022-06-14 | Discharge: 2022-06-14 | Disposition: A | Discharge status: Home / Self Care | Payer: Medicaid Other | Attending: Oral Surgery | Admitting: Oral Surgery

## 2022-06-14 ENCOUNTER — Other Ambulatory Visit: Payer: Self-pay

## 2022-06-14 ENCOUNTER — Ambulatory Visit (HOSPITAL_BASED_OUTPATIENT_CLINIC_OR_DEPARTMENT_OTHER): Payer: Medicaid Other | Admitting: Certified Registered Nurse Anesthetist

## 2022-06-14 ENCOUNTER — Encounter (HOSPITAL_COMMUNITY): Payer: Self-pay | Admitting: Oral Surgery

## 2022-06-14 ENCOUNTER — Ambulatory Visit (HOSPITAL_COMMUNITY): Payer: Medicaid Other | Admitting: Certified Registered Nurse Anesthetist

## 2022-06-14 DIAGNOSIS — F17200 Nicotine dependence, unspecified, uncomplicated: Secondary | ICD-10-CM | POA: Diagnosis not present

## 2022-06-14 DIAGNOSIS — K0889 Other specified disorders of teeth and supporting structures: Secondary | ICD-10-CM | POA: Diagnosis not present

## 2022-06-14 DIAGNOSIS — K029 Dental caries, unspecified: Secondary | ICD-10-CM

## 2022-06-14 DIAGNOSIS — I10 Essential (primary) hypertension: Secondary | ICD-10-CM | POA: Insufficient documentation

## 2022-06-14 DIAGNOSIS — Z79899 Other long term (current) drug therapy: Secondary | ICD-10-CM | POA: Diagnosis not present

## 2022-06-14 DIAGNOSIS — F332 Major depressive disorder, recurrent severe without psychotic features: Secondary | ICD-10-CM

## 2022-06-14 DIAGNOSIS — Z01818 Encounter for other preprocedural examination: Secondary | ICD-10-CM

## 2022-06-14 HISTORY — DX: Unspecified asthma, uncomplicated: J45.909

## 2022-06-14 HISTORY — DX: Myoneural disorder, unspecified: G70.9

## 2022-06-14 HISTORY — PX: TOOTH EXTRACTION: SHX859

## 2022-06-14 HISTORY — DX: Unspecified osteoarthritis, unspecified site: M19.90

## 2022-06-14 HISTORY — DX: Anxiety disorder, unspecified: F41.9

## 2022-06-14 HISTORY — DX: Gastro-esophageal reflux disease without esophagitis: K21.9

## 2022-06-14 LAB — CBC
HCT: 44.4 % (ref 36.0–46.0)
Hemoglobin: 15.5 g/dL — ABNORMAL HIGH (ref 12.0–15.0)
MCH: 32.8 pg (ref 26.0–34.0)
MCHC: 34.9 g/dL (ref 30.0–36.0)
MCV: 94.1 fL (ref 80.0–100.0)
Platelets: 336 10*3/uL (ref 150–400)
RBC: 4.72 MIL/uL (ref 3.87–5.11)
RDW: 13.3 % (ref 11.5–15.5)
WBC: 12.6 10*3/uL — ABNORMAL HIGH (ref 4.0–10.5)
nRBC: 0 % (ref 0.0–0.2)

## 2022-06-14 LAB — COMPREHENSIVE METABOLIC PANEL
ALT: 19 U/L (ref 0–44)
AST: 18 U/L (ref 15–41)
Albumin: 4 g/dL (ref 3.5–5.0)
Alkaline Phosphatase: 71 U/L (ref 38–126)
Anion gap: 11 (ref 5–15)
BUN: 10 mg/dL (ref 6–20)
CO2: 25 mmol/L (ref 22–32)
Calcium: 9.3 mg/dL (ref 8.9–10.3)
Chloride: 102 mmol/L (ref 98–111)
Creatinine, Ser: 0.7 mg/dL (ref 0.44–1.00)
GFR, Estimated: >60 mL/min (ref >60)
Glucose, Bld: 110 mg/dL — ABNORMAL HIGH (ref 70–99)
Potassium: 3.5 mmol/L (ref 3.5–5.1)
Sodium: 138 mmol/L (ref 135–145)
Total Bilirubin: 0.7 mg/dL (ref 0.3–1.2)
Total Protein: 6.9 g/dL (ref 6.5–8.1)

## 2022-06-14 SURGERY — DENTAL RESTORATION/EXTRACTIONS
Anesthesia: General

## 2022-06-14 MED ORDER — SUGAMMADEX SODIUM 200 MG/2ML IV SOLN
INTRAVENOUS | Status: DC | PRN
  Administered 2022-06-14: 150 mg via INTRAVENOUS

## 2022-06-14 MED ORDER — ORAL CARE MOUTH RINSE: 15.0000 mL | Freq: Once | OROMUCOSAL | Status: AC

## 2022-06-14 MED ORDER — PROPOFOL 10 MG/ML IV BOLUS
INTRAVENOUS | Status: DC | PRN
  Administered 2022-06-14: 200 mg via INTRAVENOUS

## 2022-06-14 MED ORDER — EPHEDRINE SULFATE (PRESSORS) 50 MG/ML IJ SOLN
INTRAMUSCULAR | Status: DC | PRN
  Administered 2022-06-14 (×2): 10 mg via INTRAVENOUS

## 2022-06-14 MED ORDER — ROCURONIUM BROMIDE 10 MG/ML (PF) SYRINGE
PREFILLED_SYRINGE | INTRAVENOUS | Status: DC | PRN
  Administered 2022-06-14: 70 mg via INTRAVENOUS

## 2022-06-14 MED ORDER — FENTANYL CITRATE (PF) 250 MCG/5ML IJ SOLN
INTRAMUSCULAR | Status: AC
  Filled 2022-06-14: qty 5

## 2022-06-14 MED ORDER — CHLORHEXIDINE GLUCONATE 0.12 % MT SOLN
15.0000 mL | Freq: Once | OROMUCOSAL | Status: AC
  Administered 2022-06-14: 15 mL via OROMUCOSAL
  Filled 2022-06-14: qty 15

## 2022-06-14 MED ORDER — LIDOCAINE 2% (20 MG/ML) 5 ML SYRINGE
INTRAMUSCULAR | Status: DC | PRN
  Administered 2022-06-14: 80 mg via INTRAVENOUS

## 2022-06-14 MED ORDER — DEXAMETHASONE SODIUM PHOSPHATE 10 MG/ML IJ SOLN
INTRAMUSCULAR | Status: AC
  Filled 2022-06-14: qty 1

## 2022-06-14 MED ORDER — FENTANYL CITRATE (PF) 250 MCG/5ML IJ SOLN
INTRAMUSCULAR | Status: DC | PRN
  Administered 2022-06-14: 100 ug via INTRAVENOUS
  Administered 2022-06-14: 50 ug via INTRAVENOUS
  Administered 2022-06-14: 100 ug via INTRAVENOUS

## 2022-06-14 MED ORDER — OXYMETAZOLINE HCL 0.05 % NA SOLN
NASAL | Status: AC
  Filled 2022-06-14: qty 120

## 2022-06-14 MED ORDER — AMOXICILLIN 500 MG PO CAPS: 500.0000 mg | ORAL_CAPSULE | Freq: Three times a day (TID) | ORAL | 0 refills | Status: DC

## 2022-06-14 MED ORDER — AMISULPRIDE (ANTIEMETIC) 5 MG/2ML IV SOLN: 10.0000 mg | Freq: Once | INTRAVENOUS | Status: DC | PRN

## 2022-06-14 MED ORDER — PHENYLEPHRINE HCL-NACL 20-0.9 MG/250ML-% IV SOLN
INTRAVENOUS | Status: AC
  Filled 2022-06-14: qty 250

## 2022-06-14 MED ORDER — DEXAMETHASONE SODIUM PHOSPHATE 10 MG/ML IJ SOLN
INTRAMUSCULAR | Status: DC | PRN
  Administered 2022-06-14: 10 mg via INTRAVENOUS

## 2022-06-14 MED ORDER — 0.9 % SODIUM CHLORIDE (POUR BTL) OPTIME
TOPICAL | Status: DC | PRN
  Administered 2022-06-14: 1000 mL

## 2022-06-14 MED ORDER — LIDOCAINE-EPINEPHRINE 2 %-1:100000 IJ SOLN
INTRAMUSCULAR | Status: AC
  Filled 2022-06-14: qty 1

## 2022-06-14 MED ORDER — EPHEDRINE 5 MG/ML INJ
INTRAVENOUS | Status: AC
  Filled 2022-06-14: qty 5

## 2022-06-14 MED ORDER — LIDOCAINE 2% (20 MG/ML) 5 ML SYRINGE
INTRAMUSCULAR | Status: AC
  Filled 2022-06-14: qty 5

## 2022-06-14 MED ORDER — OXYCODONE-ACETAMINOPHEN 5-325 MG PO TABS: 1.0000 | ORAL_TABLET | ORAL | 0 refills | Status: AC | PRN

## 2022-06-14 MED ORDER — HYDROMORPHONE HCL 1 MG/ML IJ SOLN
INTRAMUSCULAR | Status: AC
  Filled 2022-06-14: qty 1

## 2022-06-14 MED ORDER — CEFAZOLIN SODIUM-DEXTROSE 2-4 GM/100ML-% IV SOLN
2.0000 g | INTRAVENOUS | Status: AC
  Administered 2022-06-14: 2 g via INTRAVENOUS
  Filled 2022-06-14: qty 100

## 2022-06-14 MED ORDER — OXYCODONE HCL 5 MG PO TABS: 5.0000 mg | ORAL_TABLET | Freq: Once | ORAL | Status: DC | PRN

## 2022-06-14 MED ORDER — LACTATED RINGERS IV SOLN: INTRAVENOUS | Status: DC

## 2022-06-14 MED ORDER — MIDAZOLAM HCL 2 MG/2ML IJ SOLN
INTRAMUSCULAR | Status: DC | PRN
  Administered 2022-06-14: 2 mg via INTRAVENOUS

## 2022-06-14 MED ORDER — OXYMETAZOLINE HCL 0.05 % NA SOLN
NASAL | Status: DC | PRN
  Administered 2022-06-14 (×2): 2 via NASAL

## 2022-06-14 MED ORDER — SODIUM CHLORIDE 0.9 % IR SOLN
Status: DC | PRN
  Administered 2022-06-14: 250 mL

## 2022-06-14 MED ORDER — PROMETHAZINE HCL 25 MG/ML IJ SOLN: 6.2500 mg | INTRAMUSCULAR | Status: DC | PRN

## 2022-06-14 MED ORDER — PROPOFOL 10 MG/ML IV BOLUS
INTRAVENOUS | Status: AC
  Filled 2022-06-14: qty 20

## 2022-06-14 MED ORDER — ONDANSETRON HCL 4 MG/2ML IJ SOLN
INTRAMUSCULAR | Status: DC | PRN
  Administered 2022-06-14: 4 mg via INTRAVENOUS

## 2022-06-14 MED ORDER — ROCURONIUM BROMIDE 10 MG/ML (PF) SYRINGE
PREFILLED_SYRINGE | INTRAVENOUS | Status: AC
  Filled 2022-06-14: qty 10

## 2022-06-14 MED ORDER — HYDROMORPHONE HCL 1 MG/ML IJ SOLN: 0.2500 mg | INTRAMUSCULAR | Status: DC | PRN

## 2022-06-14 MED ORDER — ONDANSETRON HCL 4 MG/2ML IJ SOLN
INTRAMUSCULAR | Status: AC
  Filled 2022-06-14: qty 2

## 2022-06-14 MED ORDER — PHENYLEPHRINE 80 MCG/ML (10ML) SYRINGE FOR IV PUSH (FOR BLOOD PRESSURE SUPPORT)
PREFILLED_SYRINGE | INTRAVENOUS | Status: AC
  Filled 2022-06-14: qty 10

## 2022-06-14 MED ORDER — PHENYLEPHRINE HCL (PRESSORS) 10 MG/ML IV SOLN
INTRAVENOUS | Status: DC | PRN
  Administered 2022-06-14: 160 ug via INTRAVENOUS
  Administered 2022-06-14 (×3): 80 ug via INTRAVENOUS

## 2022-06-14 MED ORDER — KETAMINE HCL 50 MG/5ML IJ SOSY
PREFILLED_SYRINGE | INTRAMUSCULAR | Status: AC
  Filled 2022-06-14: qty 5

## 2022-06-14 MED ORDER — MIDAZOLAM HCL 2 MG/2ML IJ SOLN
INTRAMUSCULAR | Status: AC
  Filled 2022-06-14: qty 2

## 2022-06-14 MED ORDER — OXYCODONE HCL 5 MG/5ML PO SOLN: 5.0000 mg | Freq: Once | ORAL | Status: DC | PRN

## 2022-06-14 MED ORDER — LIDOCAINE-EPINEPHRINE 2 %-1:100000 IJ SOLN
INTRAMUSCULAR | Status: DC | PRN
  Administered 2022-06-14: 20 mL via INTRADERMAL

## 2022-06-14 SURGICAL SUPPLY — 36 items
BAG COUNTER SPONGE SURGICOUNT (BAG) IMPLANT
BAG SPNG CNTER NS LX DISP (BAG)
BLADE SURG 15 STRL LF DISP TIS (BLADE) ×2 IMPLANT
BLADE SURG 15 STRL SS (BLADE)
BUR CROSS CUT FISSURE 1.6 (BURR) ×2 IMPLANT
BUR EGG ELITE 4.0 (BURR) ×2 IMPLANT
CANISTER SUCT 3000ML PPV (MISCELLANEOUS) ×2 IMPLANT
COVER SURGICAL LIGHT HANDLE (MISCELLANEOUS) ×2 IMPLANT
GAUZE PACKING FOLDED 2  STR (GAUZE/BANDAGES/DRESSINGS) ×1
GAUZE PACKING FOLDED 2 STR (GAUZE/BANDAGES/DRESSINGS) ×2 IMPLANT
GLOVE BIO SURGEON STRL SZ 6.5 (GLOVE) IMPLANT
GLOVE BIO SURGEON STRL SZ7 (GLOVE) IMPLANT
GLOVE BIO SURGEON STRL SZ8 (GLOVE) ×2 IMPLANT
GLOVE BIOGEL PI IND STRL 6.5 (GLOVE) IMPLANT
GLOVE BIOGEL PI IND STRL 7.0 (GLOVE) IMPLANT
GOWN STRL REUS W/ TWL LRG LVL3 (GOWN DISPOSABLE) ×2 IMPLANT
GOWN STRL REUS W/ TWL XL LVL3 (GOWN DISPOSABLE) ×2 IMPLANT
GOWN STRL REUS W/TWL LRG LVL3 (GOWN DISPOSABLE) ×1
GOWN STRL REUS W/TWL XL LVL3 (GOWN DISPOSABLE) ×1
IV NS 1000ML (IV SOLUTION) ×1
IV NS 1000ML BAXH (IV SOLUTION) ×2 IMPLANT
KIT BASIN OR (CUSTOM PROCEDURE TRAY) ×2 IMPLANT
KIT TURNOVER KIT B (KITS) ×2 IMPLANT
NDL HYPO 25GX1X1/2 BEV (NEEDLE) ×4 IMPLANT
NEEDLE HYPO 25GX1X1/2 BEV (NEEDLE) ×2 IMPLANT
NS IRRIG 1000ML POUR BTL (IV SOLUTION) ×2 IMPLANT
PAD ARMBOARD 7.5X6 YLW CONV (MISCELLANEOUS) ×2 IMPLANT
SLEEVE IRRIGATION ELITE 7 (MISCELLANEOUS) ×2 IMPLANT
SPIKE FLUID TRANSFER (MISCELLANEOUS) ×2 IMPLANT
SPONGE SURGIFOAM ABS GEL 12-7 (HEMOSTASIS) IMPLANT
SUT CHROMIC 3 0 PS 2 (SUTURE) ×2 IMPLANT
SYR BULB IRRIG 60ML STRL (SYRINGE) ×2 IMPLANT
SYR CONTROL 10ML LL (SYRINGE) ×2 IMPLANT
TRAY ENT MC OR (CUSTOM PROCEDURE TRAY) ×2 IMPLANT
TUBING IRRIGATION (MISCELLANEOUS) ×2 IMPLANT
YANKAUER SUCT BULB TIP NO VENT (SUCTIONS) ×2 IMPLANT

## 2022-06-14 NOTE — Op Note (Signed)
NAME: Rachel Knapp, KOHLMAN MEDICAL RECORD NO: [id number] ACCOUNT NO: [id number] DATE OF BIRTH: [birth year] FACILITY: [facility] LOCATION: [facility] PHYSICIAN: Georgia Lopes, DDS  Operative Report   DATE OF PROCEDURE: 06/14/2022  PREOPERATIVE DIAGNOSES:  Nonrestorable teeth secondary to dental caries numbers 6, 7, 8, 10, 11, 12, 13, 14, 15, 20, 22, 23, 24, 25, 26, 27, 28, 29, 30, 31.  POSTOPERATIVE DIAGNOSES:  Nonrestorable teeth secondary to dental caries numbers 6, 7, 8, 10, 11, 12, 13, 14, 15, 20, 22, 23, 24, 25, 26, 27, 28, 29, 30, 31.  PROCEDURE:  Extraction teeth numbers 6, 7, 8, 10, 11, 12, 13, 14, 15, 20, 22, 23, 24, 25, 26, 27, 28, 29, 30, 31.  SURGEON:  Georgia Lopes, DDS  ANESTHESIA:  General.  Dr. Hyacinth Meeker attending.  DESCRIPTION OF PROCEDURE:  The patient was taken to the operating room and placed on the table in supine position.  General anesthesia was administered and nasal endotracheal tube was placed and secured.  The eyes were protected and the patient was  draped for surgery.  Timeout was performed.  The posterior pharynx was suctioned and a throat pack was placed.  2% lidocaine in 1:100,000 epinephrine was infiltrated in an inferior alveolar block on the right and left side and buccal and palatal  infiltration in the maxilla around the teeth to be removed.  A bite block was placed on the right side of the mouth, the left side was operated first.  A 15 blade was used to make an incision on the alveolar ridge 1 cm proximal to tooth number 20 and  carried forward both buccally and lingually in the gingival sulcus until tooth number 27 was encountered.  The periosteum was reflected.  The teeth were elevated and teeth numbers 20, 22, 23, 24, 25 and 26 were removed with the Ash forceps.  The sockets  were then debrided, curetted and the periosteum was reflected to expose the alveolar crest. Alveoloplasty was performed using the egg bur followed by the bone file.  Then, this area was  irrigated and closed with 3-0 chromic.  Then the 15 blade was used  to make an incision around tooth number 15, carried forward around teeth numbers 14, 13, 12, 11 in the buccal and palatal sulcus.  The periosteum was reflected.  Then the teeth were elevated with 301 elevator and removed with dental forceps.  The palatal  root fractured in tooth number 12 necessitating removal of the root with the root tip pick.  Then, the sockets were curetted and debrided and the tissue was reflected to expose the alveolar crest, which was irregular in contour.  Alveoloplasty was then  performed using the egg bur followed by the bone file.  Then, this area was irrigated and closed with 3-0 chromic.  Then, the bite block was repositioned to the other side of the mouth.  The 15 blade was used to make an incision around teeth numbers 6, 7  and 8 in the maxilla and around teeth numbers 27, 28, 29, 30, 31 in the mandible.  The periosteum was reflected.  The teeth were elevated and removed with the dental forceps.  The sockets were curetted, irrigated, debrided and then alveoplasty was  performed using the egg bur and then the bone file. Then the areas were irrigated and closed with 3-0 chromic.  The oral cavity was then irrigated and suctioned.  Additional local anesthesia was administered.  The throat pack was removed.  The patient  was left  under care of anesthesia for extubation and transport to recovery, planning for discharge through day surgery.  ESTIMATED BLOOD LOSS:  Minimal.  COMPLICATIONS:  None.  SPECIMEN:  None.   VAI D: 06/14/2022 8:30:30 am T: 06/14/2022 9:03:00 am  JOB: [id number]/ [id number]

## 2022-06-14 NOTE — Anesthesia Postprocedure Evaluation (Signed)
Anesthesia Post Note  Patient: Rachel Knapp  Procedure(s) Performed: DENTAL RESTORATION/EXTRACTIONS     Patient location during evaluation: PACU Anesthesia Type: General Level of consciousness: awake and alert Pain management: pain level controlled Vital Signs Assessment: post-procedure vital signs reviewed and stable Respiratory status: spontaneous breathing, nonlabored ventilation and respiratory function stable Cardiovascular status: blood pressure returned to baseline and stable Postop Assessment: no apparent nausea or vomiting Anesthetic complications: no   No notable events documented.  Last Vitals:  Vitals:   06/14/22 0930 06/14/22 0945  BP: 115/63 115/67  Pulse: 79 75  Resp: 14 20  Temp:  (!) 36.4 C  SpO2: 92% 94%    Last Pain:  Vitals:   06/14/22 0930  TempSrc:   PainSc: 0-No pain                 Lowella Curb

## 2022-06-14 NOTE — H&P (Signed)
H&P documentation  -History and Physical Reviewed  -Patient has been re-examined  -No change in the plan of care  Rachel Knapp  

## 2022-06-14 NOTE — Transfer of Care (Signed)
Immediate Anesthesia Transfer of Care Note  Patient: Rachel Knapp  Procedure(s) Performed: DENTAL RESTORATION/EXTRACTIONS  Patient Location: PACU  Anesthesia Type:General  Level of Consciousness: awake, alert , and oriented  Airway & Oxygen Therapy: Patient Spontanous Breathing and Patient connected to nasal cannula oxygen  Post-op Assessment: Report given to RN and Post -op Vital signs reviewed and stable  Post vital signs: Reviewed and stable  Last Vitals:  Vitals Value Taken Time  BP 116/51 06/14/22 0833  Temp    Pulse 100 06/14/22 0835  Resp 17 06/14/22 0835  SpO2 88 % 06/14/22 0835  Vitals shown include unvalidated device data.  Last Pain:  Vitals:   06/14/22 0624  TempSrc:   PainSc: 0-No pain         Complications: No notable events documented.

## 2022-06-14 NOTE — Anesthesia Preprocedure Evaluation (Addendum)
Anesthesia Evaluation  Patient identified by MRN, date of birth, ID band Patient awake    Reviewed: Allergy & Precautions, H&P , NPO status , Patient's Chart, lab work & pertinent test results  Airway Mallampati: II  TM Distance: >3 FB Neck ROM: Full    Dental  (+) Poor Dentition   Pulmonary asthma , Current Smoker and Patient abstained from smoking.   Pulmonary exam normal breath sounds clear to auscultation       Cardiovascular hypertension, Pt. on medications Normal cardiovascular exam Rhythm:Regular Rate:Normal     Neuro/Psych   Anxiety Depression    negative neurological ROS  negative psych ROS   GI/Hepatic Neg liver ROS,GERD  ,,  Endo/Other  negative endocrine ROS    Renal/GU negative Renal ROS  negative genitourinary   Musculoskeletal  (+) Arthritis , Osteoarthritis,    Abdominal   Peds negative pediatric ROS (+)  Hematology negative hematology ROS (+)   Anesthesia Other Findings   Reproductive/Obstetrics negative OB ROS                             Anesthesia Physical Anesthesia Plan  ASA: 2  Anesthesia Plan: General   Post-op Pain Management:    Induction: Intravenous  PONV Risk Score and Plan: 2 and Ondansetron, Midazolam and Treatment may vary due to age or medical condition  Airway Management Planned: Nasal ETT  Additional Equipment:   Intra-op Plan:   Post-operative Plan: Extubation in OR  Informed Consent: I have reviewed the patients History and Physical, chart, labs and discussed the procedure including the risks, benefits and alternatives for the proposed anesthesia with the patient or authorized representative who has indicated his/her understanding and acceptance.     Dental advisory given  Plan Discussed with: CRNA  Anesthesia Plan Comments:        Anesthesia Quick Evaluation

## 2022-06-14 NOTE — Anesthesia Procedure Notes (Signed)
Procedure Name: Intubation Date/Time: 06/14/2022 7:30 AM  Performed by: Alwyn Ren, CRNAPre-anesthesia Checklist: Patient identified, Emergency Drugs available, Suction available and Patient being monitored Patient Re-evaluated:Patient Re-evaluated prior to induction Oxygen Delivery Method: Circle system utilized Preoxygenation: Pre-oxygenation with 100% oxygen Induction Type: IV induction Ventilation: Mask ventilation without difficulty Laryngoscope Size: Glidescope and 3 Grade View: Grade I Nasal Tubes: Nasal prep performed and Nasal Rae Tube size: 7.0 mm Number of attempts: 1 Airway Equipment and Method: Video-laryngoscopy Placement Confirmation: ETT inserted through vocal cords under direct vision, positive ETCO2 and breath sounds checked- equal and bilateral Tube secured with: Tape Dental Injury: Teeth and Oropharynx as per pre-operative assessment  Comments: Left nare more patent per patient. Pre op afrin bilateral nares x 2. Left nare dilated with 7 and 8 nasal airway.

## 2022-06-14 NOTE — H&P (Signed)
Anesthesia H&P Update: History and Physical Exam reviewed; patient is OK for planned anesthetic and procedure. ? ?

## 2022-06-14 NOTE — Op Note (Signed)
06/14/2022  8:26 AM  PATIENT:  Rachel Knapp  52 y.o. female  PRE-OPERATIVE DIAGNOSIS:  Non-restorable teeth # 6, 7, 8, 10, 11, 12, 13, 14, 15, 20, 22, 23, 24, 25, 26, 27, 28, 29, 30, 31 secondary to DENTAL CARIES  POST-OPERATIVE DIAGNOSIS:  SAME  PROCEDURE:  Procedure(s):Extraction teeth # 6, 7, 8, 10, 11, 12, 13, 14, 15, 20, 22, 23, 24, 25, 26, 27, 28, 29, 30, 31. Alveoloplasty.    SURGEON:  Surgeon(s): Ocie Doyne, DMD  ANESTHESIA:   local and general  EBL:  minimal  DRAINS: none   SPECIMEN:  No Specimen  COUNTS:  YES  PLAN OF CARE: Discharge to home after PACU  PATIENT DISPOSITION:  PACU - hemodynamically stable.   PROCEDURE DETAILS: Dictation # [id number]  Georgia Lopes, DMD 06/14/2022 8:26 AM

## 2022-10-01 ENCOUNTER — Ambulatory Visit: Admit: 2022-10-01 | Payer: Medicaid Other | Admitting: Oral Surgery

## 2022-10-01 SURGERY — DENTAL RESTORATION/EXTRACTIONS
Anesthesia: General

## 2022-12-19 ENCOUNTER — Other Ambulatory Visit: Payer: Self-pay

## 2022-12-19 ENCOUNTER — Emergency Department (HOSPITAL_COMMUNITY)
Admission: EM | Admit: 2022-12-19 | Discharge: 2022-12-19 | Disposition: A | Discharge status: Home / Self Care | Payer: Medicaid Other | Attending: Emergency Medicine | Admitting: Emergency Medicine

## 2022-12-19 ENCOUNTER — Encounter (HOSPITAL_COMMUNITY): Payer: Self-pay | Admitting: Emergency Medicine

## 2022-12-19 ENCOUNTER — Emergency Department (HOSPITAL_COMMUNITY): Payer: Medicaid Other

## 2022-12-19 DIAGNOSIS — D72829 Elevated white blood cell count, unspecified: Secondary | ICD-10-CM | POA: Insufficient documentation

## 2022-12-19 DIAGNOSIS — I10 Essential (primary) hypertension: Secondary | ICD-10-CM | POA: Insufficient documentation

## 2022-12-19 DIAGNOSIS — R34 Anuria and oliguria: Secondary | ICD-10-CM | POA: Insufficient documentation

## 2022-12-19 DIAGNOSIS — M545 Low back pain, unspecified: Secondary | ICD-10-CM | POA: Insufficient documentation

## 2022-12-19 DIAGNOSIS — R112 Nausea with vomiting, unspecified: Secondary | ICD-10-CM | POA: Insufficient documentation

## 2022-12-19 DIAGNOSIS — Z79899 Other long term (current) drug therapy: Secondary | ICD-10-CM | POA: Insufficient documentation

## 2022-12-19 LAB — CBC WITH DIFFERENTIAL/PLATELET
Abs Immature Granulocytes: 0.05 10*3/uL (ref 0.00–0.07)
Basophils Absolute: 0.1 10*3/uL (ref 0.0–0.1)
Basophils Relative: 1 %
Eosinophils Absolute: 0.1 10*3/uL (ref 0.0–0.5)
Eosinophils Relative: 0 %
HCT: 45.5 % (ref 36.0–46.0)
Hemoglobin: 15.1 g/dL — ABNORMAL HIGH (ref 12.0–15.0)
Immature Granulocytes: 0 %
Lymphocytes Relative: 27 %
Lymphs Abs: 3.1 10*3/uL (ref 0.7–4.0)
MCH: 31.7 pg (ref 26.0–34.0)
MCHC: 33.2 g/dL (ref 30.0–36.0)
MCV: 95.4 fL (ref 80.0–100.0)
Monocytes Absolute: 0.4 10*3/uL (ref 0.1–1.0)
Monocytes Relative: 4 %
Neutro Abs: 8 10*3/uL — ABNORMAL HIGH (ref 1.7–7.7)
Neutrophils Relative %: 68 %
Platelets: 303 10*3/uL (ref 150–400)
RBC: 4.77 MIL/uL (ref 3.87–5.11)
RDW: 12.6 % (ref 11.5–15.5)
WBC: 11.8 10*3/uL — ABNORMAL HIGH (ref 4.0–10.5)
nRBC: 0 % (ref 0.0–0.2)

## 2022-12-19 LAB — URINALYSIS, W/ REFLEX TO CULTURE (INFECTION SUSPECTED)
Bacteria, UA: NONE SEEN
Bilirubin Urine: NEGATIVE
Glucose, UA: NEGATIVE mg/dL
Ketones, ur: NEGATIVE mg/dL
Leukocytes,Ua: NEGATIVE
Nitrite: NEGATIVE
Protein, ur: 30 mg/dL — AB
Specific Gravity, Urine: 1.018 (ref 1.005–1.030)
pH: 7 (ref 5.0–8.0)

## 2022-12-19 LAB — BASIC METABOLIC PANEL
Anion gap: 13 (ref 5–15)
BUN: 16 mg/dL (ref 6–20)
CO2: 25 mmol/L (ref 22–32)
Calcium: 9.7 mg/dL (ref 8.9–10.3)
Chloride: 102 mmol/L (ref 98–111)
Creatinine, Ser: 0.57 mg/dL (ref 0.44–1.00)
GFR, Estimated: >60 mL/min (ref >60)
Glucose, Bld: 111 mg/dL — ABNORMAL HIGH (ref 70–99)
Potassium: 3.9 mmol/L (ref 3.5–5.1)
Sodium: 140 mmol/L (ref 135–145)

## 2022-12-19 MED ORDER — METHOCARBAMOL 500 MG PO TABS: 500.0000 mg | ORAL_TABLET | Freq: Two times a day (BID) | ORAL | 0 refills | Status: DC

## 2022-12-19 MED ORDER — ONDANSETRON HCL 4 MG/2ML IJ SOLN
4.0000 mg | Freq: Once | INTRAMUSCULAR | Status: AC
  Administered 2022-12-19: 4 mg via INTRAVENOUS
  Filled 2022-12-19: qty 2

## 2022-12-19 MED ORDER — MORPHINE SULFATE (PF) 4 MG/ML IV SOLN
4.0000 mg | Freq: Once | INTRAVENOUS | Status: AC
  Administered 2022-12-19: 4 mg via INTRAVENOUS
  Filled 2022-12-19: qty 1

## 2022-12-19 MED ORDER — LIDOCAINE 5 % EX PTCH
1.0000 | MEDICATED_PATCH | CUTANEOUS | Status: DC
  Administered 2022-12-19: 1 via TRANSDERMAL
  Filled 2022-12-19: qty 1

## 2022-12-19 MED ORDER — LIDOCAINE 5 % EX PTCH: 1.0000 | MEDICATED_PATCH | CUTANEOUS | 0 refills | Status: DC

## 2022-12-19 NOTE — ED Provider Notes (Signed)
[place] EMERGENCY DEPARTMENT AT [facility] Provider Note   CSN: [id number] Arrival date & time: 12/19/22  1446     History  Chief Complaint  Patient presents with   Back Pain    Rachel Knapp is a 52 y.o. female history of cocaine abuse, MDD, hypertension presented with low back pain for the past 3 days.  Patient states she has history of kidney stones and that this feels the same.  Patient endorses oliguria but denies any dysuria or hematuria.  Patient does state that she has had a few episodes of nausea and vomiting but denies any fevers or abdominal pain.  Patient denies any recent trauma to her back or saddle anesthesia or urinary/bowel incontinence.  Patient denies any new onset weakness or paresthesias.  Patient thinks that there is something wrong with her kidneys and wants to be evaluated.  Patient does note that it hurts to move but denies any heavy lifting.  Patient denied vaginal bleeding or hematuria.  Home Medications Prior to Admission medications   Medication Sig Start Date End Date Taking? Authorizing Provider  lidocaine (LIDODERM) 5 % Place 1 patch onto the skin daily. Remove & Discard patch within 12 hours or as directed by MD 12/19/22  Yes [place] Valli, Beverly Gust, PA-C  methocarbamol (ROBAXIN) 500 MG tablet Take 1 tablet (500 mg total) by mouth 2 (two) times daily. 12/19/22  Yes Jakobee Brackins, Beverly Gust, PA-C  amoxicillin (AMOXIL) 500 MG capsule Take 1 capsule (500 mg total) by mouth 3 (three) times daily. 06/14/22   Ocie Doyne, DMD  cyclobenzaprine (FLEXERIL) 5 MG tablet Take 1 tablet (5 mg total) by mouth 3 (three) times daily as needed for muscle spasms. Patient taking differently: Take 10 mg by mouth 3 (three) times daily. 11/11/17   Armandina Stammer I, NP  gabapentin (NEURONTIN) 400 MG capsule Take 1 capsule (400 mg total) by mouth 3 (three) times daily. For agitation Patient taking differently: Take 300 mg by mouth 3 (three) times daily. For agitation 11/11/17   Armandina Stammer I, NP  lisinopril-hydrochlorothiazide (ZESTORETIC) 20-12.5 MG tablet Take 1 tablet by mouth daily. 04/15/22   [provider]  VENTOLIN HFA 108 (90 Base) MCG/ACT inhaler Inhale 2 puffs into the lungs every 6 (six) hours as needed for wheezing or shortness of breath. 01/02/22   [provider]      Allergies    Patient has no known allergies.    Review of Systems   Review of Systems  Musculoskeletal:  Positive for back pain.    Physical Exam Updated Vital Signs BP 109/71   Pulse 84   Temp 98.4 F (36.9 C) (Oral)   Resp 20   LMP 12/17/2015   SpO2 92%  Physical Exam Vitals reviewed.  Constitutional:      General: She is in acute distress.  HENT:     Head: Normocephalic and atraumatic.  Eyes:     Extraocular Movements: Extraocular movements intact.     Conjunctiva/sclera: Conjunctivae normal.     Pupils: Pupils are equal, round, and reactive to light.  Cardiovascular:     Rate and Rhythm: Normal rate and regular rhythm.     Pulses: Normal pulses.     Heart sounds: Normal heart sounds.     Comments: 2+ bilateral radial/dorsalis pedis pulses with regular rate Pulmonary:     Effort: Pulmonary effort is normal. No respiratory distress.     Breath sounds: Normal breath sounds.  Abdominal:  General: There is no distension.     Palpations: Abdomen is soft.     Tenderness: There is no abdominal tenderness. There is right CVA tenderness and left CVA tenderness. There is no guarding or rebound.  Musculoskeletal:        General: Normal range of motion.     Cervical back: Normal range of motion and neck supple.     Right lower leg: No edema.     Left lower leg: No edema.     Comments: 5 out of 5 bilateral grip/leg extension strength Paralumbar musculature tenderness however no midline tenderness or abnormalities palpated Pelvis stable  Skin:    General: Skin is warm and dry.     Capillary Refill: Capillary refill takes less than 2 seconds.     Findings:  No rash.  Neurological:     General: No focal deficit present.     Mental Status: She is alert and oriented to person, place, and time.     Deep Tendon Reflexes:     Reflex Scores:      Patellar reflexes are 2+ on the right side and 2+ on the left side.    Comments: Sensation intact in all 4 limbs Able to ambulate however this does exacerbate the back pain  Psychiatric:        Mood and Affect: Mood normal.     ED Results / Procedures / Treatments   Labs (all labs ordered are listed, but only abnormal results are displayed) Labs Reviewed  CBC WITH DIFFERENTIAL/PLATELET - Abnormal; Notable for the following components:      Result Value   WBC 11.8 (*)    Hemoglobin 15.1 (*)    Neutro Abs 8.0 (*)    All other components within normal limits  URINALYSIS, W/ REFLEX TO CULTURE (INFECTION SUSPECTED) - Abnormal; Notable for the following components:   Hgb urine dipstick SMALL (*)    Protein, ur 30 (*)    All other components within normal limits  BASIC METABOLIC PANEL - Abnormal; Notable for the following components:   Glucose, Bld 111 (*)    All other components within normal limits    EKG None  Radiology DG Lumbar Spine 2-3 Views  Result Date: 12/19/2022 CLINICAL DATA:  Low back pain. EXAM: LUMBAR SPINE - 2-3 VIEW COMPARISON:  Lumbar radiograph 09/07/2014 FINDINGS: Five non-rib-bearing lumbar vertebra. Normal lumbar alignment. Normal vertebral body heights. Moderate L4-L5 and L5-S1 facet hypertrophy. Trace anterior spurring at multiple levels with preservation of disc spaces. No evidence of fracture or focal bone abnormality. No visible pars defects. The sacroiliac joints are congruent with mild degenerative change. IMPRESSION: 1. Moderate L4-L5 and L5-S1 facet hypertrophy. 2. Mild degenerative change of the sacroiliac joints. Electronically Signed   By: Narda Rutherford M.D.   On: 12/19/2022 19:14   CT Renal Stone Study  Result Date: 12/19/2022 CLINICAL DATA:  Sharp mid back  pain. Similar to prior kidney stone. Decreased urine output. EXAM: CT ABDOMEN AND PELVIS WITHOUT CONTRAST TECHNIQUE: Multidetector CT imaging of the abdomen and pelvis was performed following the standard protocol without IV contrast. RADIATION DOSE REDUCTION: This exam was performed according to the departmental dose-optimization program which includes automated exposure control, adjustment of the mA and/or kV according to patient size and/or use of iterative reconstruction technique. COMPARISON:  CT abdomen and pelvis 11/05/2009 FINDINGS: Lower chest: No acute abnormality. Hepatobiliary: Hepatic steatosis. Normal gallbladder. No biliary dilation. Pancreas: Unremarkable. Spleen: Unremarkable. Adrenals/Urinary Tract: Stable adrenal glands. No urinary calculi or  hydronephrosis. Bladder is unremarkable. Stomach/Bowel: Normal caliber large and small bowel. Colonic diverticulosis without diverticulitis. No bowel wall thickening. The appendix is normal.Stomach is within normal limits. Vascular/Lymphatic: Aortic atherosclerosis. No enlarged abdominal or pelvic lymph nodes. Reproductive: Unremarkable. Other: No free intraperitoneal fluid or air. Musculoskeletal: No acute fracture. IMPRESSION: 1. No acute abnormality in the abdomen or pelvis. 2. Hepatic steatosis. 3. Colonic diverticulosis without diverticulitis. Aortic Atherosclerosis (ICD10-I70.0). Electronically Signed   By: Minerva Fester M.D.   On: 12/19/2022 16:46    Procedures Procedures    Medications Ordered in ED Medications  lidocaine (LIDODERM) 5 % 1 patch (1 patch Transdermal Patch Applied 12/19/22 1823)  morphine (PF) 4 MG/ML injection 4 mg (4 mg Intravenous Given 12/19/22 1552)  ondansetron (ZOFRAN) injection 4 mg (4 mg Intravenous Given 12/19/22 1552)    ED Course/ Medical Decision Making/ A&P                                 Medical Decision Making  KAYLONIE BLANDFORD 52 y.o. presented today for flank pain. Working DDx that I considered at  this time includes, but not limited to, MSK, nephrolithiasis, pyelonephritis, AAA, aortic dissection, mesenteric ischemia, RCC, obstructive uropathy, renal infarct/hemorrhage, tumor, biliary colic, pancreatitis, appendicitis, SBO, diverticulitis, shingles, lower lobe pneumonia, ectopic pregnancy, PID/TOA, ovarian torsion, endometriosis.  R/o DDx: nephrolithiasis, pyelonephritis, AAA, aortic dissection, mesenteric ischemia, RCC, obstructive uropathy, renal infarct/hemorrhage, tumor, biliary colic, pancreatitis, appendicitis, SBO, diverticulitis, shingles, lower lobe pneumonia, ectopic pregnancy, PID/TOA, ovarian torsion, endometriosis: These are considered less likely due to history of present illness, physical exam, lab/imaging findings  Review of prior external notes: 06/14/2022 op note  Unique Tests and My Interpretation:  CBC: Leukocytosis however this does appear to be chronic BMP: Unremarkable UA: Unremarkable CT Renal stone study: No acute pathology Lumbar x-ray: Moderate facet hypertrophy with SI arthritis  Discussion with Independent Historian: None  Discussion of Management of Tests: None  Risk: Medium: prescription drug management  Risk Stratification Score: None  Plan: On exam patient was acute distress as she appeared uncomfortable in the bed but has stable vitals.  Patient did have bilateral CVA tenderness however when I evaluated patient's back she did not have any midline tenderness or concerning features for spinal cord injury at this time.  Patient will be given pain meds and will obtain labs and scans patient states that she is having oliguria and states she has history of stones.  Patient given Zofran for reported nausea.  Labs were unremarkable including the CT scan.  Patient did endorse the pain hurting when she moved and with this CVA tenderness and tenderness in the paralumbar region in the musculature this could be MSK and so will give lidocaine patches.  X-ray  shows moderate facet hypertrophy along with arthritis in the SI joints which are both likely contributing patient's back pain.  Patient did have paralumbar muscular tenderness as well and so suspect patient is having MSK pain which would explain her symptoms.  Patient does have 2+ bilateral patellar reflexes and is able to ambulate and does not Dors any red flag symptoms at this time.  Patient given lidocaine patches will be given prescription for this as well.  Patient requests a different medication than her Flexeril and so we will give her Robaxin as a muscle relaxant encouraged to use Tylenol every 6 hours and to rest for the next few days.  Patient was given return precautions. Patient  stable for discharge at this time.  Patient verbalized understanding of plan.  This chart was dictated using voice recognition software.  Despite best efforts to proofread,  errors can occur which can change the documentation meaning.         Final Clinical Impression(s) / ED Diagnoses Final diagnoses:  Acute low back pain without sciatica, unspecified back pain laterality    Rx / DC Orders ED Discharge Orders          Ordered    lidocaine (LIDODERM) 5 %  Every 24 hours        12/19/22 1922    methocarbamol (ROBAXIN) 500 MG tablet  2 times daily        12/19/22 [address] 12/19/22 1930    Glyn Ade, MD 12/20/22 1507

## 2022-12-19 NOTE — Discharge Instructions (Addendum)
Please follow-up with your primary care provider regards resumes ER visit.  Today your exam and imaging show that you have swelling between some vertebrae and your low back (facet hypertrophy) along with arthritis in your pelvis that are likely contributing to your pain.  Your labs are reassuring you do not have a UTI or kidney infection or electrolyte abnormalities.  You may pick up the rest of the lidocaine patches and Robaxin at your pharmacy.  Please do not use your your Flexeril with Robaxin as these are both muscle relaxers and that would lead to bad side effects.  You may take Tylenol every 6 hours as needed for pain the next few days as symptoms change or worsen please return to ER.

## 2022-12-19 NOTE — ED Triage Notes (Signed)
Patient presents form home due to sharp mid back pain that reminds her of the last time she had a kidney stone. Also, the past 3 days she has noticed oliguria. EMS administered 100 mcg of Fentanyl and 200 ml of normal saline.     EMS vitals: 117 HR 100% SPO2 on room air

## 2022-12-23 NOTE — Plan of Care (Signed)
[facility] Tonsillectomy/Adenoidectomy, Postoperative PEDS care plan entered in error.

## 2023-11-27 ENCOUNTER — Ambulatory Visit (HOSPITAL_COMMUNITY)
Admission: EM | Admit: 2023-11-27 | Discharge: 2023-11-28 | Disposition: A | Discharge status: Other Acute Inpatient Hospital | Attending: Psychiatry | Admitting: Psychiatry

## 2023-11-27 DIAGNOSIS — F339 Major depressive disorder, recurrent, unspecified: Secondary | ICD-10-CM | POA: Insufficient documentation

## 2023-11-27 DIAGNOSIS — F1410 Cocaine abuse, uncomplicated: Secondary | ICD-10-CM | POA: Diagnosis not present

## 2023-11-27 DIAGNOSIS — Z560 Unemployment, unspecified: Secondary | ICD-10-CM | POA: Insufficient documentation

## 2023-11-27 DIAGNOSIS — R45851 Suicidal ideations: Secondary | ICD-10-CM

## 2023-11-27 DIAGNOSIS — F1290 Cannabis use, unspecified, uncomplicated: Secondary | ICD-10-CM | POA: Insufficient documentation

## 2023-11-27 DIAGNOSIS — R9431 Abnormal electrocardiogram [ECG] [EKG]: Secondary | ICD-10-CM | POA: Insufficient documentation

## 2023-11-27 DIAGNOSIS — F4310 Post-traumatic stress disorder, unspecified: Secondary | ICD-10-CM | POA: Insufficient documentation

## 2023-11-27 DIAGNOSIS — Z5902 Unsheltered homelessness: Secondary | ICD-10-CM | POA: Insufficient documentation

## 2023-11-27 LAB — CBC WITH DIFFERENTIAL/PLATELET
Abs Immature Granulocytes: 0.04 K/uL (ref 0.00–0.07)
Basophils Absolute: 0.1 K/uL (ref 0.0–0.1)
Basophils Relative: 1 %
Eosinophils Absolute: 0.2 K/uL (ref 0.0–0.5)
Eosinophils Relative: 2 %
HCT: 41.3 % (ref 36.0–46.0)
Hemoglobin: 13.5 g/dL (ref 12.0–15.0)
Immature Granulocytes: 0 %
Lymphocytes Relative: 51 %
Lymphs Abs: 6.2 K/uL — ABNORMAL HIGH (ref 0.7–4.0)
MCH: 31.6 pg (ref 26.0–34.0)
MCHC: 32.7 g/dL (ref 30.0–36.0)
MCV: 96.7 fL (ref 80.0–100.0)
Monocytes Absolute: 0.7 K/uL (ref 0.1–1.0)
Monocytes Relative: 6 %
Neutro Abs: 4.8 K/uL (ref 1.7–7.7)
Neutrophils Relative %: 40 %
Platelets: 270 K/uL (ref 150–400)
RBC: 4.27 MIL/uL (ref 3.87–5.11)
RDW: 13.4 % (ref 11.5–15.5)
WBC: 12.1 K/uL — ABNORMAL HIGH (ref 4.0–10.5)
nRBC: 0 % (ref 0.0–0.2)

## 2023-11-27 LAB — POCT URINE DRUG SCREEN - MANUAL ENTRY (I-SCREEN)
POC Amphetamine UR: NOT DETECTED
POC Buprenorphine (BUP): NOT DETECTED
POC Cocaine UR: POSITIVE — AB
POC Marijuana UR: POSITIVE — AB
POC Methadone UR: NOT DETECTED
POC Methamphetamine UR: NOT DETECTED
POC Morphine: NOT DETECTED
POC Oxazepam (BZO): NOT DETECTED
POC Oxycodone UR: POSITIVE — AB
POC Secobarbital (BAR): NOT DETECTED

## 2023-11-27 LAB — COMPREHENSIVE METABOLIC PANEL WITH GFR
ALT: 17 U/L (ref 0–44)
AST: 18 U/L (ref 15–41)
Albumin: 3.7 g/dL (ref 3.5–5.0)
Alkaline Phosphatase: 91 U/L (ref 38–126)
Anion gap: 11 (ref 5–15)
BUN: 16 mg/dL (ref 6–20)
CO2: 28 mmol/L (ref 22–32)
Calcium: 9.2 mg/dL (ref 8.9–10.3)
Chloride: 106 mmol/L (ref 98–111)
Creatinine, Ser: 0.72 mg/dL (ref 0.44–1.00)
GFR, Estimated: >60 mL/min (ref >60)
Glucose, Bld: 100 mg/dL — ABNORMAL HIGH (ref 70–99)
Potassium: 3.7 mmol/L (ref 3.5–5.1)
Sodium: 145 mmol/L (ref 135–145)
Total Bilirubin: 0.5 mg/dL (ref 0.0–1.2)
Total Protein: 6 g/dL — ABNORMAL LOW (ref 6.5–8.1)

## 2023-11-27 LAB — POC URINE PREG, ED: Preg Test, Ur: NEGATIVE

## 2023-11-27 LAB — TSH: TSH: 1.136 u[IU]/mL (ref 0.350–4.500)

## 2023-11-27 LAB — ETHANOL: Alcohol, Ethyl (B): <15 mg/dL (ref <15)

## 2023-11-27 MED ORDER — METHOCARBAMOL 500 MG PO TABS
500.0000 mg | ORAL_TABLET | Freq: Three times a day (TID) | ORAL | Status: DC | PRN
  Administered 2023-11-27: 500 mg via ORAL
  Filled 2023-11-27: qty 1

## 2023-11-27 MED ORDER — LOPERAMIDE HCL 2 MG PO CAPS: 2.0000 mg | ORAL_CAPSULE | ORAL | Status: DC | PRN

## 2023-11-27 MED ORDER — MAGNESIUM HYDROXIDE 400 MG/5ML PO SUSP: 30.0000 mL | Freq: Every day | ORAL | Status: DC | PRN

## 2023-11-27 MED ORDER — HALOPERIDOL 5 MG PO TABS
5.0000 mg | ORAL_TABLET | Freq: Three times a day (TID) | ORAL | Status: DC | PRN
  Administered 2023-11-27: 5 mg via ORAL
  Filled 2023-11-27: qty 1

## 2023-11-27 MED ORDER — OLANZAPINE 10 MG IM SOLR: 5.0000 mg | Freq: Three times a day (TID) | INTRAMUSCULAR | Status: DC | PRN

## 2023-11-27 MED ORDER — NAPROXEN 500 MG PO TABS
500.0000 mg | ORAL_TABLET | Freq: Two times a day (BID) | ORAL | Status: DC | PRN
  Administered 2023-11-27: 500 mg via ORAL
  Filled 2023-11-27: qty 1

## 2023-11-27 MED ORDER — ONDANSETRON 4 MG PO TBDP: 4.0000 mg | ORAL_TABLET | Freq: Four times a day (QID) | ORAL | Status: DC | PRN

## 2023-11-27 MED ORDER — ACETAMINOPHEN 325 MG PO TABS: 650.0000 mg | ORAL_TABLET | Freq: Four times a day (QID) | ORAL | Status: DC | PRN

## 2023-11-27 MED ORDER — DIPHENHYDRAMINE HCL 50 MG PO CAPS
50.0000 mg | ORAL_CAPSULE | Freq: Three times a day (TID) | ORAL | Status: DC | PRN
  Administered 2023-11-27: 50 mg via ORAL
  Filled 2023-11-27: qty 1

## 2023-11-27 MED ORDER — DICYCLOMINE HCL 20 MG PO TABS
20.0000 mg | ORAL_TABLET | Freq: Four times a day (QID) | ORAL | Status: DC | PRN
  Administered 2023-11-27: 20 mg via ORAL
  Filled 2023-11-27: qty 1

## 2023-11-27 MED ORDER — HYDROXYZINE HCL 25 MG PO TABS
25.0000 mg | ORAL_TABLET | Freq: Four times a day (QID) | ORAL | Status: DC | PRN
  Administered 2023-11-27: 25 mg via ORAL
  Filled 2023-11-27: qty 1

## 2023-11-27 MED ORDER — OLANZAPINE 10 MG IM SOLR: 10.0000 mg | Freq: Three times a day (TID) | INTRAMUSCULAR | Status: DC | PRN

## 2023-11-27 MED ORDER — ALUM & MAG HYDROXIDE-SIMETH 200-200-20 MG/5ML PO SUSP: 30.0000 mL | ORAL | Status: DC | PRN
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
## 2023-11-28 ENCOUNTER — Other Ambulatory Visit: Payer: Self-pay

## 2023-11-28 ENCOUNTER — Inpatient Hospital Stay (HOSPITAL_COMMUNITY)
Admission: AD | Admit: 2023-11-28 | Discharge: 2023-12-05 | DRG: 885 | Disposition: A | Discharge status: Home / Self Care | Source: Intra-hospital

## 2023-11-28 ENCOUNTER — Encounter (HOSPITAL_COMMUNITY): Payer: Self-pay

## 2023-11-28 ENCOUNTER — Encounter (HOSPITAL_COMMUNITY): Payer: Self-pay | Admitting: Psychiatry

## 2023-11-28 DIAGNOSIS — F1520 Other stimulant dependence, uncomplicated: Secondary | ICD-10-CM | POA: Diagnosis present

## 2023-11-28 DIAGNOSIS — R45851 Suicidal ideations: Secondary | ICD-10-CM | POA: Diagnosis present

## 2023-11-28 DIAGNOSIS — F17200 Nicotine dependence, unspecified, uncomplicated: Secondary | ICD-10-CM | POA: Diagnosis not present

## 2023-11-28 DIAGNOSIS — F332 Major depressive disorder, recurrent severe without psychotic features: Secondary | ICD-10-CM | POA: Diagnosis present

## 2023-11-28 DIAGNOSIS — R197 Diarrhea, unspecified: Secondary | ICD-10-CM | POA: Diagnosis present

## 2023-11-28 DIAGNOSIS — F1420 Cocaine dependence, uncomplicated: Secondary | ICD-10-CM | POA: Diagnosis not present

## 2023-11-28 DIAGNOSIS — F17210 Nicotine dependence, cigarettes, uncomplicated: Secondary | ICD-10-CM | POA: Diagnosis present

## 2023-11-28 DIAGNOSIS — Z5941 Food insecurity: Secondary | ICD-10-CM | POA: Diagnosis not present

## 2023-11-28 DIAGNOSIS — Z5982 Transportation insecurity: Secondary | ICD-10-CM

## 2023-11-28 DIAGNOSIS — K219 Gastro-esophageal reflux disease without esophagitis: Secondary | ICD-10-CM | POA: Diagnosis present

## 2023-11-28 DIAGNOSIS — Z555 Less than a high school diploma: Secondary | ICD-10-CM

## 2023-11-28 DIAGNOSIS — Z818 Family history of other mental and behavioral disorders: Secondary | ICD-10-CM

## 2023-11-28 DIAGNOSIS — E781 Pure hyperglyceridemia: Secondary | ICD-10-CM | POA: Diagnosis present

## 2023-11-28 DIAGNOSIS — Z813 Family history of other psychoactive substance abuse and dependence: Secondary | ICD-10-CM | POA: Diagnosis not present

## 2023-11-28 DIAGNOSIS — F1910 Other psychoactive substance abuse, uncomplicated: Secondary | ICD-10-CM | POA: Insufficient documentation

## 2023-11-28 DIAGNOSIS — F4310 Post-traumatic stress disorder, unspecified: Secondary | ICD-10-CM | POA: Diagnosis present

## 2023-11-28 DIAGNOSIS — Z560 Unemployment, unspecified: Secondary | ICD-10-CM

## 2023-11-28 DIAGNOSIS — M069 Rheumatoid arthritis, unspecified: Secondary | ICD-10-CM | POA: Diagnosis present

## 2023-11-28 DIAGNOSIS — F1410 Cocaine abuse, uncomplicated: Secondary | ICD-10-CM | POA: Diagnosis present

## 2023-11-28 DIAGNOSIS — M542 Cervicalgia: Secondary | ICD-10-CM | POA: Diagnosis present

## 2023-11-28 DIAGNOSIS — F17203 Nicotine dependence unspecified, with withdrawal: Secondary | ICD-10-CM | POA: Diagnosis not present

## 2023-11-28 DIAGNOSIS — Z5900 Homelessness unspecified: Secondary | ICD-10-CM | POA: Diagnosis not present

## 2023-11-28 DIAGNOSIS — F410 Panic disorder [episodic paroxysmal anxiety] without agoraphobia: Secondary | ICD-10-CM | POA: Diagnosis present

## 2023-11-28 DIAGNOSIS — Z9151 Personal history of suicidal behavior: Secondary | ICD-10-CM | POA: Diagnosis not present

## 2023-11-28 DIAGNOSIS — Z91148 Patient's other noncompliance with medication regimen for other reason: Secondary | ICD-10-CM

## 2023-11-28 DIAGNOSIS — G8929 Other chronic pain: Secondary | ICD-10-CM | POA: Diagnosis present

## 2023-11-28 DIAGNOSIS — J45909 Unspecified asthma, uncomplicated: Secondary | ICD-10-CM | POA: Diagnosis present

## 2023-11-28 DIAGNOSIS — R630 Anorexia: Secondary | ICD-10-CM | POA: Diagnosis present

## 2023-11-28 DIAGNOSIS — Z716 Tobacco abuse counseling: Secondary | ICD-10-CM

## 2023-11-28 DIAGNOSIS — I10 Essential (primary) hypertension: Secondary | ICD-10-CM | POA: Diagnosis present

## 2023-11-28 DIAGNOSIS — F411 Generalized anxiety disorder: Secondary | ICD-10-CM | POA: Diagnosis present

## 2023-11-28 DIAGNOSIS — G629 Polyneuropathy, unspecified: Secondary | ICD-10-CM | POA: Diagnosis present

## 2023-11-28 DIAGNOSIS — Z79899 Other long term (current) drug therapy: Secondary | ICD-10-CM

## 2023-11-28 MED ORDER — ACETAMINOPHEN 325 MG PO TABS
650.0000 mg | ORAL_TABLET | Freq: Four times a day (QID) | ORAL | Status: DC | PRN
  Administered 2023-11-29 – 2023-12-03 (×5): 650 mg via ORAL
  Filled 2023-11-28 (×5): qty 2

## 2023-11-28 MED ORDER — MAGNESIUM HYDROXIDE 400 MG/5ML PO SUSP: 30.0000 mL | Freq: Every day | ORAL | Status: DC | PRN

## 2023-11-28 MED ORDER — OLANZAPINE 10 MG IM SOLR: 10.0000 mg | Freq: Three times a day (TID) | INTRAMUSCULAR | Status: DC | PRN

## 2023-11-28 MED ORDER — LISINOPRIL 20 MG PO TABS
20.0000 mg | ORAL_TABLET | Freq: Every day | ORAL | Status: DC
  Administered 2023-11-28 – 2023-12-05 (×8): 20 mg via ORAL
  Filled 2023-11-28 (×7): qty 1

## 2023-11-28 MED ORDER — OLANZAPINE 10 MG IM SOLR: 5.0000 mg | Freq: Three times a day (TID) | INTRAMUSCULAR | Status: DC | PRN

## 2023-11-28 MED ORDER — HALOPERIDOL 5 MG PO TABS: 5.0000 mg | ORAL_TABLET | Freq: Three times a day (TID) | ORAL | Status: DC | PRN

## 2023-11-28 MED ORDER — MIRTAZAPINE 7.5 MG PO TABS
7.5000 mg | ORAL_TABLET | Freq: Every day | ORAL | Status: DC
  Administered 2023-11-28 – 2023-11-30 (×3): 7.5 mg via ORAL
  Filled 2023-11-28 (×3): qty 1

## 2023-11-28 MED ORDER — DIPHENHYDRAMINE HCL 25 MG PO CAPS: 50.0000 mg | ORAL_CAPSULE | Freq: Three times a day (TID) | ORAL | Status: DC | PRN

## 2023-11-28 MED ORDER — AMLODIPINE BESYLATE 5 MG PO TABS
5.0000 mg | ORAL_TABLET | Freq: Every day | ORAL | Status: DC
  Administered 2023-11-28 – 2023-12-05 (×8): 5 mg via ORAL
  Filled 2023-11-28 (×8): qty 1

## 2023-11-28 MED ORDER — TRAZODONE HCL 50 MG PO TABS
50.0000 mg | ORAL_TABLET | Freq: Every evening | ORAL | Status: DC | PRN
  Administered 2023-11-29 – 2023-12-04 (×6): 50 mg via ORAL
  Filled 2023-11-28 (×6): qty 1

## 2023-11-28 MED ORDER — HYDROCHLOROTHIAZIDE 12.5 MG PO TABS
12.5000 mg | ORAL_TABLET | Freq: Every day | ORAL | Status: DC
  Administered 2023-11-28 – 2023-12-04 (×7): 12.5 mg via ORAL
  Filled 2023-11-28 (×8): qty 1

## 2023-11-28 MED ORDER — METFORMIN HCL 500 MG PO TABS
1000.0000 mg | ORAL_TABLET | Freq: Two times a day (BID) | ORAL | Status: DC
Start: 2023-11-28 — End: 2023-12-05
  Administered 2023-11-28 – 2023-12-05 (×14): 1000 mg via ORAL
  Filled 2023-11-28 (×14): qty 2

## 2023-11-28 MED ORDER — LISINOPRIL-HYDROCHLOROTHIAZIDE 20-12.5 MG PO TABS: 1.0000 | ORAL_TABLET | Freq: Every day | ORAL | Status: DC

## 2023-11-28 MED ORDER — HYDROXYZINE HCL 25 MG PO TABS
25.0000 mg | ORAL_TABLET | Freq: Three times a day (TID) | ORAL | Status: DC | PRN
  Administered 2023-12-01 – 2023-12-04 (×7): 25 mg via ORAL
  Filled 2023-11-28 (×7): qty 1

## 2023-11-28 MED ORDER — DULOXETINE HCL 20 MG PO CPEP
20.0000 mg | ORAL_CAPSULE | Freq: Every day | ORAL | Status: DC
  Administered 2023-11-28 – 2023-11-29 (×2): 20 mg via ORAL
  Filled 2023-11-28 (×2): qty 1

## 2023-11-28 MED ORDER — NICOTINE 14 MG/24HR TD PT24
14.0000 mg | MEDICATED_PATCH | Freq: Every day | TRANSDERMAL | Status: DC
  Administered 2023-11-28 – 2023-12-05 (×8): 14 mg via TRANSDERMAL
  Filled 2023-11-28 (×8): qty 1

## 2023-11-28 MED ORDER — ALBUTEROL SULFATE HFA 108 (90 BASE) MCG/ACT IN AERS: 2.0000 | INHALATION_SPRAY | Freq: Four times a day (QID) | RESPIRATORY_TRACT | Status: DC | PRN

## 2023-11-28 MED ORDER — ALUM & MAG HYDROXIDE-SIMETH 200-200-20 MG/5ML PO SUSP
15.0000 mL | Freq: Four times a day (QID) | ORAL | Status: DC | PRN
  Administered 2023-12-04: 15 mL via ORAL
  Filled 2023-11-28: qty 30

## 2023-11-28 NOTE — Plan of Care (Signed)

## 2023-11-28 NOTE — Group Note (Deleted)
 Date:  11/28/2023 Time:  10:40 PM  Group Topic/Focus:  Emotional Education:   The focus of this group is to discuss what feelings/emotions are, and how they are experienced.     Participation Level:  {BHH PARTICIPATION OZCZO:77735}  Participation Quality:  {BHH PARTICIPATION QUALITY:22265}  Affect:  {BHH AFFECT:22266}  Cognitive:  {BHH COGNITIVE:22267}  Insight: {BHH Insight2:20797}  Engagement in Group:  {BHH ENGAGEMENT IN HMNLE:77731}  Modes of Intervention:  {BHH MODES OF INTERVENTION:22269}  Additional Comments:  ***  Weiland Tomich L 11/28/2023, 10:40 PM

## 2023-11-28 NOTE — Group Note (Signed)
 Recreation Therapy Group Note   Group Topic:Leisure Education  Group Date: 11/28/2023 Start Time: 0935 End Time: 1003 Facilitators: Kashae Carstens-McCall, LRT,CTRS Location: [address] Dayroom   Group Topic: Leisure Education  Goal Area(s) Addresses:  Patient will identify positive leisure activities.  Patient will identify one positive benefit of participation in leisure activities.   Behavioral Response:   Intervention: Leisure Group Game  Activity: Patient, MHT, and LRT participated in playing a trivia game of Guess the [place]. Participants took turns flicking the spinner. Whatever category (R&B, Hip Hop, Pop, Indie, Dance and Rock) the spinner landed on, the person would be read the lyrics and the person would have guess the missing lyric. The person with the most cards at the end of game won.  Education:  Leisure Exposure, Pharmacist, community, Discharge Planning  Education Outcome: Acknowledges education/In group clarification offered/Needs additional education   Affect/Mood: N/A   Participation Level: Did not attend    Clinical Observations/Individualized Feedback:     Plan: Continue to engage patient in RT group sessions 2-3x/week.   Kamariyah Timberlake-McCall, LRT,CTRS 11/28/2023 12:21 PM
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
## 2023-11-28 NOTE — BH IP Treatment Plan (Signed)
 Interdisciplinary Treatment and Diagnostic Plan Update  11/28/2023 Time of Session: 10:00 AM  Rachel Knapp MRN: [id number]  Principal Diagnosis: Depression with suicidal ideation  Secondary Diagnoses: Principal Problem:   Depression with suicidal ideation   Current Medications:  Current Facility-Administered Medications  Medication Dose Route Frequency Provider Last Rate Last Admin   haloperidol  (HALDOL ) tablet 5 mg  5 mg Oral TID PRN Trudy Carwin, NP       And   diphenhydrAMINE  (BENADRYL ) capsule 50 mg  50 mg Oral TID PRN Trudy Carwin, NP       DULoxetine  (CYMBALTA ) DR capsule 20 mg  20 mg Oral Daily Pashayan, Alexander S, DO       mirtazapine  (REMERON ) tablet 7.5 mg  7.5 mg Oral QHS Pashayan, Alexander S, DO       nicotine  (NICODERM CQ  - dosed in mg/24 hours) patch 14 mg  14 mg Transdermal Daily Trudy Carwin, NP       OLANZapine  (ZYPREXA ) injection 10 mg  10 mg Intramuscular TID PRN Trudy Carwin, NP       OLANZapine  (ZYPREXA ) injection 5 mg  5 mg Intramuscular TID PRN Trudy Carwin, NP       PTA Medications: Medications Prior to Admission  Medication Sig Dispense Refill Last Dose/Taking   amLODipine  (NORVASC ) 5 MG tablet Take 5 mg by mouth daily.   Past Month   lisinopril -hydrochlorothiazide  (ZESTORETIC ) 20-12.5 MG tablet Take 1 tablet by mouth daily.   Past Month   metFORMIN  (GLUCOPHAGE ) 1000 MG tablet Take 1,000 mg by mouth 2 (two) times daily with a meal.   Past Month   methocarbamol  (ROBAXIN ) 500 MG tablet Take 500 mg by mouth 3 (three) times daily.   Past Month   VENTOLIN  HFA 108 (90 Base) MCG/ACT inhaler Inhale 2 puffs into the lungs every 6 (six) hours as needed for wheezing or shortness of breath.   Past Month   gabapentin  (NEURONTIN ) 400 MG capsule Take 1 capsule (400 mg total) by mouth 3 (three) times daily. For agitation 90 capsule 0     Patient Stressors: Financial difficulties   Health problems   Marital or family conflict   Substance abuse    Patient  Strengths: Motivation for treatment/growth   Treatment Modalities: Medication Management, Group therapy, Case management,  1 to 1 session with clinician, Psychoeducation, Recreational therapy.   Physician Treatment Plan for Primary Diagnosis: Depression with suicidal ideation Long Term Goal(s):     Short Term Goals:    Medication Management: Evaluate patient's response, side effects, and tolerance of medication regimen.  Therapeutic Interventions: 1 to 1 sessions, Unit Group sessions and Medication administration.  Evaluation of Outcomes: Not Progressing  Physician Treatment Plan for Secondary Diagnosis: Principal Problem:   Depression with suicidal ideation  Long Term Goal(s):     Short Term Goals:       Medication Management: Evaluate patient's response, side effects, and tolerance of medication regimen.  Therapeutic Interventions: 1 to 1 sessions, Unit Group sessions and Medication administration.  Evaluation of Outcomes: Not Progressing   RN Treatment Plan for Primary Diagnosis: Depression with suicidal ideation Long Term Goal(s): Knowledge of disease and therapeutic regimen to maintain health will improve  Short Term Goals: Ability to remain free from injury will improve, Ability to verbalize frustration and anger appropriately will improve, Ability to demonstrate self-control, Ability to participate in decision making will improve, Ability to verbalize feelings will improve, Ability to disclose and discuss suicidal ideas, Ability to identify and develop effective coping  behaviors will improve, and Compliance with prescribed medications will improve  Medication Management: RN will administer medications as ordered by provider, will assess and evaluate patient's response and provide education to patient for prescribed medication. RN will report any adverse and/or side effects to prescribing provider.  Therapeutic Interventions: 1 on 1 counseling sessions, Psychoeducation,  Medication administration, Evaluate responses to treatment, Monitor vital signs and CBGs as ordered, Perform/monitor CIWA, COWS, AIMS and Fall Risk screenings as ordered, Perform wound care treatments as ordered.  Evaluation of Outcomes: Not Progressing   LCSW Treatment Plan for Primary Diagnosis: Depression with suicidal ideation Long Term Goal(s): Safe transition to appropriate next level of care at discharge, Engage patient in therapeutic group addressing interpersonal concerns.  Short Term Goals: Engage patient in aftercare planning with referrals and resources, Increase social support, Increase ability to appropriately verbalize feelings, Increase emotional regulation, Facilitate acceptance of mental health diagnosis and concerns, Facilitate patient progression through stages of change regarding substance use diagnoses and concerns, Identify triggers associated with mental health/substance abuse issues, and Increase skills for wellness and recovery  Therapeutic Interventions: Assess for all discharge needs, 1 to 1 time with Social worker, Explore available resources and support systems, Assess for adequacy in community support network, Educate family and significant other(s) on suicide prevention, Complete Psychosocial Assessment, Interpersonal group therapy.  Evaluation of Outcomes: Not Progressing   Progress in Treatment: Attending groups: No. Participating in groups: No. Taking medication as prescribed: Yes. Toleration medication: Yes. Family/Significant other contact made: No, will contact:  consents are pending Patient understands diagnosis: Yes. Discussing patient identified problems/goals with staff: Yes. Medical problems stabilized or resolved: Yes. Denies suicidal/homicidal ideation: Yes. Issues/concerns per patient self-inventory: No.  New problem(s) identified:  No  New Short Term/Long Term Goal(s):    medication stabilization, elimination of SI thoughts, development of  comprehensive mental wellness plan.    Patient Goals:  I want to work on my depression and go to inpatient drug treatment.  Discharge Plan or Barriers:  Patient recently admitted. CSW will continue to follow and assess for appropriate referrals and possible discharge planning.   Reason for Continuation of Hospitalization: Depression Medication stabilization Suicidal ideation  Estimated Length of Stay:  5 - 7 days  Last 3 [place] Suicide Severity Risk Score: Flowsheet Row [facility]Current[facility] from 11[facility]27/2023 in [facility] ED from 12/19/2022 in [facility] Emergency Department at [facility]  C-SSRS RISK CATEGORY High Risk High Risk No Risk    Last [facility] 2/9 Scores:     No data to display          Scribe for Treatment Team: Jakirah Zaun O Ari Bernabei, LCSWA 11/28/2023 11:39 AM

## 2023-11-28 NOTE — BHH Group Notes (Signed)
 BHH Group Notes:  (Nursing/MHT/Case Management/Adjunct)  Date:  11/28/2023  Time:  8:35 PM  Type of Therapy:  Groups  Participation Level:  Did Not Attend  Participation Quality:    Affect:    Cognitive:    Insight:    Engagement in Group:    Modes of Intervention:    Summary of Progress/Problems: Refused to attend groups.   Grayce LITTIE Essex 11/28/2023, 8:35 PM
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
## 2023-11-28 NOTE — Group Note (Signed)
 Date:  11/28/2023 Time:  9:01 AM  Group Topic/Focus:  Goals Group:   The focus of this group is to help patients establish daily goals to achieve during treatment and discuss how the patient can incorporate goal setting into their daily lives to aide in recovery. Patients were asked two questions: What is something you can do today to take care of your mental health? What is 1 thing you're proud of doing lately?    Participation Level:  Did Not Attend  Participation Quality:  N/A  Affect:  N/A  Cognitive:  N/A  Insight: None  Engagement in Group:  N/A  Modes of Intervention:  N/A  Additional Comments:  Patient did not attend goals group.  Kristi HERO Elliott Lasecki 11/28/2023, 9:01 AM

## 2023-11-28 NOTE — Progress Notes (Signed)
   11/28/23 1305  Psych Admission Type (Psych Patients Only)  Admission Status Voluntary  Psychosocial Assessment  Patient Complaints Anxiety;Depression  Eye Contact Fair  Facial Expression Sad  Affect Depressed  Speech Logical/coherent  Interaction Assertive  Motor Activity Slow  Appearance/Hygiene Body odor  Behavior Characteristics Cooperative  Mood Depressed  Thought Process  Coherency WDL  Content Blaming others  Delusions None reported or observed  Perception WDL  Hallucination None reported or observed  Judgment Poor  Confusion None  Danger to Self  Current suicidal ideation? Passive  Agreement Not to Harm Self Yes  Description of Agreement verbal  Danger to Others  Danger to Others None reported or observed

## 2023-11-29 DIAGNOSIS — F332 Major depressive disorder, recurrent severe without psychotic features: Secondary | ICD-10-CM | POA: Diagnosis not present

## 2023-11-29 DIAGNOSIS — F4310 Post-traumatic stress disorder, unspecified: Secondary | ICD-10-CM | POA: Diagnosis not present

## 2023-11-29 DIAGNOSIS — F411 Generalized anxiety disorder: Secondary | ICD-10-CM | POA: Diagnosis not present

## 2023-11-29 DIAGNOSIS — F17200 Nicotine dependence, unspecified, uncomplicated: Secondary | ICD-10-CM | POA: Diagnosis not present

## 2023-11-29 LAB — LIPID PANEL
Cholesterol: 192 mg/dL (ref 0–200)
HDL: 43 mg/dL (ref >40)
LDL Cholesterol: 104 mg/dL — ABNORMAL HIGH (ref 0–99)
Total CHOL/HDL Ratio: 4.5 ratio
Triglycerides: 224 mg/dL — ABNORMAL HIGH (ref <150)
VLDL: 45 mg/dL — ABNORMAL HIGH (ref 0–40)

## 2023-11-29 LAB — HEMOGLOBIN A1C
Hgb A1c MFr Bld: 5.3 % (ref 4.8–5.6)
Mean Plasma Glucose: 105.41 mg/dL

## 2023-11-29 MED ORDER — GABAPENTIN 100 MG PO CAPS
100.0000 mg | ORAL_CAPSULE | Freq: Three times a day (TID) | ORAL | Status: DC
  Administered 2023-11-30: 100 mg via ORAL
  Filled 2023-11-29: qty 1

## 2023-11-29 MED ORDER — DULOXETINE HCL 30 MG PO CPEP
30.0000 mg | ORAL_CAPSULE | Freq: Every day | ORAL | Status: DC
  Administered 2023-11-30 – 2023-12-05 (×6): 30 mg via ORAL
  Filled 2023-11-29 (×6): qty 1

## 2023-11-29 NOTE — Plan of Care (Signed)
   Problem: Education: Goal: Knowledge of [place] [facility] Education information/materials will improve Outcome: Progressing Goal: Emotional status will improve Outcome: Progressing Goal: Mental status will improve Outcome: Progressing Goal: Verbalization of understanding the information provided will improve Outcome: Progressing

## 2023-11-29 NOTE — Group Note (Signed)
 Date:  11/29/2023 Time:  5:02 PM  Group Topic/Focus:  Developing a Wellness Toolbox:   The focus of this group is to help patients develop a wellness toolbox with skills and strategies to promote recovery upon discharge. Identifying Needs:   The focus of this group is to help patients identify their personal needs that have been historically problematic and identify healthy behaviors to address their needs.    Participation Level:  Did Not Attend  Participation Quality:    Affect:    Cognitive:    Insight:   Engagement in Group:    Modes of Intervention:    Additional Comments:    Rachel Knapp 11/29/2023, 5:02 PM

## 2023-11-29 NOTE — Progress Notes (Signed)
 [facility] MD Progress Note  11/29/2023 10:54 AM Rachel Knapp  MRN:  [id number] Subjective:   Rachel Knapp is a 53 yr old female who presented on 9/25 to [facility] with worsening depression and SI w/ a plan (cut wrists), she was admitted to [facility] on 9/26. PPHx is significant for Depression, Anxiety, PTSD, and Polysubstance Abuse, and 1 Suicide Attempt (hanging- 10/2017), and 1 Prior Psychiatric Hospitalization [facility] 10/2017), and no history of Self Injurious Behavior.    Case was discussed in the multidisciplinary team. MAR was reviewed and patient was compliant with medications.  She did not receive any PRN medications yesterday.   Psychiatric Team made the following recommendations yesterday: -Start Cymbalta  20 mg daily for depression, anxiety, and neuropathy -Start Remeron  7.5 mg QHS for depression, anxiety, sleep, and appetite    On interview today patient reports she slept fair last night.  She reports her appetite is doing fair.  She reports she still has SI but that it is less intense and improving.  She reports no HI or AVH.  She reports no Paranoia or Ideas of Reference.  She reports no issues with her medications.  She reports that she did have one nightmare last night but that this is a significant improvement as normally she has multiple nightmares every night.  Discussed with her that we would increase her Cymbalta  tomorrow and she was agreeable.  She reports no other concerns at present.   Principal Problem: MDD (major depressive disorder), recurrent severe, without psychosis (HCC) Diagnosis: Principal Problem:   MDD (major depressive disorder), recurrent severe, without psychosis (HCC) Active Problems:   GAD (generalized anxiety disorder)   PTSD (post-traumatic stress disorder)   Polysubstance abuse (HCC)  Total Time spent with patient:  I personally spent 35 minutes on the unit in direct patient care. The direct patient care time included face-to-face time with the patient, reviewing  the patient's chart, communicating with other professionals, and coordinating care.    Past Psychiatric History:  Depression, Anxiety, PTSD, and Polysubstance Abuse, and 1 Suicide Attempt (hanging- 10/2017), and 1 Prior Psychiatric Hospitalization [facility] 10/2017), and no history of Self Injurious Behavior.   Past Medical History:  Past Medical History:  Diagnosis Date   Anxiety    Arthritis    Asthma    Depression    GERD (gastroesophageal reflux disease)    Hypertension    Neuromuscular disorder (HCC)    neuropathy    Past Surgical History:  Procedure Laterality Date   TONSILLECTOMY     TOOTH EXTRACTION N/A 06/14/2022   Procedure: DENTAL RESTORATION/EXTRACTIONS;  Surgeon: Sheryle Hamilton, DMD;  Location: [facility] OR;  Service: Oral Surgery;  Laterality: N/A;   tubes in ears     TYMPANOSTOMY TUBE PLACEMENT     Family History: History reviewed. No pertinent family history. Family Psychiatric  History:  Mother- Depression, Substance Abuse Paternal Uncle- Schizophrenia Maternal Uncle- Schizophrenia Brother- THC Abuse Multiple Members substance abuse No Known Suicides  Social History:  Social History   Substance and Sexual Activity  Alcohol Use No     Social History   Substance and Sexual Activity  Drug Use Yes   Types: Cocaine, Marijuana, Crack cocaine    Social History   Socioeconomic History   Marital status: Single    Spouse name: Not on file   Number of children: Not on file   Years of education: Not on file   Highest education level: Not on file  Occupational History   Not on file  Tobacco Use   Smoking status: Every Day    Current packs/day: 0.50    Types: Cigarettes   Smokeless tobacco: Never  Vaping Use   Vaping status: Never Used  Substance and Sexual Activity   Alcohol use: No   Drug use: Yes    Types: Cocaine, Marijuana, Crack cocaine   Sexual activity: Yes    Birth control/protection: None  Other Topics Concern   Not on file  Social History  Narrative   Not on file   Social Drivers of Health   Financial Resource Strain: Not on file  Food Insecurity: Food Insecurity Present (11/28/2023)   Hunger Vital Sign    Worried About Running Out of Food in the Last Year: Often true    Ran Out of Food in the Last Year: Often true  Transportation Needs: Unmet Transportation Needs (11/28/2023)   PRAPARE - Administrator, Civil Service (Medical): Yes    Lack of Transportation (Non-Medical): Yes  Physical Activity: Not on file  Stress: Not on file  Social Connections: Not on file   Additional Social History:                         Sleep: Fair Estimated Sleeping Duration (Last 24 Hours): 6.50-8.25 hours  Appetite:  Fair  Current Medications: Current Facility-Administered Medications  Medication Dose Route Frequency Provider Last Rate Last Admin   acetaminophen  (TYLENOL ) tablet 650 mg  650 mg Oral Q6H PRN Gannon Heinzman S, DO   650 mg at 11/29/23 9161   albuterol  (VENTOLIN  HFA) 108 (90 Base) MCG/ACT inhaler 2 puff  2 puff Inhalation Q6H PRN Makyah Lavigne S, DO       alum & mag hydroxide-simeth (MAALOX/MYLANTA) 200-200-20 MG/5ML suspension 15 mL  15 mL Oral Q6H PRN Kamariya Blevens S, DO       amLODipine  (NORVASC ) tablet 5 mg  5 mg Oral Daily Anthany Thornhill S, DO   5 mg at 11/29/23 9160   haloperidol  (HALDOL ) tablet 5 mg  5 mg Oral TID PRN Trudy Carwin, NP       And   diphenhydrAMINE  (BENADRYL ) capsule 50 mg  50 mg Oral TID PRN Trudy Carwin, NP       NOREEN ON 11/30/2023] DULoxetine  (CYMBALTA ) DR capsule 30 mg  30 mg Oral Daily Ephrem Carrick S, DO       lisinopril  (ZESTRIL ) tablet 20 mg  20 mg Oral Daily Landy, Terri L, RPH   20 mg at 11/29/23 9160   And   hydrochlorothiazide  (HYDRODIURIL ) tablet 12.5 mg  12.5 mg Oral Daily Landy Veva CROME, RPH   12.5 mg at 11/29/23 9160   hydrOXYzine  (ATARAX ) tablet 25 mg  25 mg Oral TID PRN Raliegh Marsa RAMAN, DO       magnesium  hydroxide (MILK OF  MAGNESIA) suspension 30 mL  30 mL Oral Daily PRN Ha Shannahan S, DO       metFORMIN  (GLUCOPHAGE ) tablet 1,000 mg  1,000 mg Oral BID WC Kamarius Buckbee S, DO   1,000 mg at 11/29/23 9161   mirtazapine  (REMERON ) tablet 7.5 mg  7.5 mg Oral QHS Caillou Minus S, DO   7.5 mg at 11/28/23 2131   nicotine  (NICODERM CQ  - dosed in mg/24 hours) patch 14 mg  14 mg Transdermal Daily Trudy Carwin, NP   14 mg at 11/29/23 9160   OLANZapine  (ZYPREXA ) injection 10 mg  10 mg Intramuscular TID PRN Trudy Carwin, NP  OLANZapine  (ZYPREXA ) injection 5 mg  5 mg Intramuscular TID PRN Trudy Carwin, NP       traZODone  (DESYREL ) tablet 50 mg  50 mg Oral QHS PRN Raliegh Marsa RAMAN, DO        Lab Results:  Results for orders placed or performed during the hospital encounter of 11/28/23 (from the past 48 hours)  Lipid panel     Status: Abnormal   Collection Time: 11/29/23  6:45 AM  Result Value Ref Range   Cholesterol 192 0 - 200 mg/dL    Comment:        ATP III CLASSIFICATION:  <200     mg/dL   Desirable  799-760  mg/dL   Borderline High  >=759    mg/dL   High           Triglycerides 224 (H) <150 mg/dL   HDL 43 >59 mg/dL   Total CHOL/HDL Ratio 4.5 RATIO   VLDL 45 (H) 0 - 40 mg/dL   LDL Cholesterol 895 (H) 0 - 99 mg/dL    Comment:        Total Cholesterol/HDL:CHD Risk Coronary Heart Disease Risk Table                     Men   Women  1/2 Average Risk   3.4   3.3  Average Risk       5.0   4.4  2 X Average Risk   9.6   7.1  3 X Average Risk  23.4   11.0        Use the calculated Patient Ratio above and the CHD Risk Table to determine the patient's CHD Risk.        ATP III CLASSIFICATION (LDL):  <100     mg/dL   Optimal  899-870  mg/dL   Near or Above                    Optimal  130-159  mg/dL   Borderline  839-810  mg/dL   High  >809     mg/dL   Very High Performed at [facility], [address], [place], [place] [address]   Hemoglobin A1c     Status: None    Collection Time: 11/29/23  6:45 AM  Result Value Ref Range   Hgb A1c MFr Bld 5.3 4.8 - 5.6 %    Comment: (NOTE) Diagnosis of Diabetes The following HbA1c ranges recommended by the American Diabetes Association (ADA) may be used as an aid in the diagnosis of diabetes mellitus.  Hemoglobin             Suggested A1C NGSP%              Diagnosis  <5.7                   Non Diabetic  5.7-6.4                Pre-Diabetic  >6.4                   Diabetic  <7.0                   Glycemic control for                       adults with diabetes.     Mean Plasma Glucose 105.41 mg/dL    Comment: Performed at  [facility], [address], [place], [place] [address]    Blood Alcohol level:  Lab Results  Component Value Date   [facility] <15 11/27/2023   ETH <10 10/31/2017    Metabolic Disorder Labs: Lab Results  Component Value Date   HGBA1C 5.3 11/29/2023   MPG 105.41 11/29/2023   No results found for: PROLACTIN Lab Results  Component Value Date   CHOL 192 11/29/2023   TRIG 224 (H) 11/29/2023   HDL 43 11/29/2023   CHOLHDL 4.5 11/29/2023   VLDL 45 (H) 11/29/2023   LDLCALC 104 (H) 11/29/2023    Physical Findings: AIMS:  ,  ,  ,  ,  ,  ,   CIWA:    COWS:     Musculoskeletal: Strength & Muscle Tone: within normal limits Gait & Station: normal Patient leans: N/A  Psychiatric Specialty Exam:  Presentation  General Appearance:  Appropriate for Environment; Casual  Eye Contact: Good  Speech: Clear and Coherent; Normal Rate  Speech Volume: Normal  Handedness: Right   Mood and Affect  Mood: Dysphoric; Anxious  Affect: Congruent; Depressed; Flat   Thought Process  Thought Processes: Coherent; Goal Directed  Descriptions of Associations:Intact  Orientation:Full (Time, Place and Person)  Thought Content:Logical; WDL  History of Schizophrenia/Schizoaffective disorder:No  Duration of Psychotic Symptoms:No data  recorded Hallucinations:Hallucinations: None  Ideas of Reference:None  Suicidal Thoughts:Suicidal Thoughts: Yes, Active (improving)  Homicidal Thoughts:Homicidal Thoughts: No   Sensorium  Memory: Immediate Fair  Judgment: Intact  Insight: Present   Executive Functions  Concentration: Fair  Attention Span: Fair  Recall: Fair  Fund of Knowledge: Fair  Language: Good   Psychomotor Activity  Psychomotor Activity: Psychomotor Activity: Normal   Assets  Assets: Communication Skills; Desire for Improvement; Resilience   Sleep  Sleep: Sleep: Fair    Physical Exam: Physical Exam Vitals and nursing note reviewed.  Constitutional:      General: She is not in acute distress.    Appearance: Normal appearance. She is normal weight. She is not ill-appearing or toxic-appearing.  HENT:     Head: Normocephalic and atraumatic.  Pulmonary:     Effort: Pulmonary effort is normal.  Musculoskeletal:        General: Normal range of motion.  Neurological:     General: No focal deficit present.     Mental Status: She is alert.    Review of Systems  Respiratory:  Negative for cough and shortness of breath.   Cardiovascular:  Negative for chest pain.  Gastrointestinal:  Negative for abdominal pain, constipation, diarrhea, nausea and vomiting.  Neurological:  Negative for dizziness, weakness and headaches.  Psychiatric/Behavioral:  Positive for depression and suicidal ideas (improving). Negative for hallucinations. The patient is nervous/anxious.    Blood pressure (!) 145/89, pulse 64, temperature 97.8 F (36.6 C), temperature source Oral, resp. rate 18, height 5' 5 (1.651 m), weight 67.1 kg, last menstrual period 12/17/2015, SpO2 100%. Body mass index is 24.63 kg/m.   Treatment Plan Summary: Daily contact with patient to assess and evaluate symptoms and progress in treatment and Medication management   Rachel Knapp is a 53 yr old female who presented on  9/25 to [facility] with worsening depression and SI w/ a plan (cut wrists), she was admitted to [facility] on 9/26.  PPHx is significant for Depression, Anxiety, PTSD, and Polysubstance Abuse, and 1 Suicide Attempt (hanging- 10/2017), and 1 Prior Psychiatric Hospitalization [facility] 10/2017), and no history of Self Injurious Behavior.  Rachel Knapp has tolerated starting the Cymbalta  and Remeron .  She has had some improvement but continues to have significant symptoms.  We will plan to increase her Cymbalta  tomorrow.  We will continue to monitor.      MDD, Recurrent, Severe, w/out Psychosis  GAD  PTSD: -Continue Cymbalta  20 mg daily for depression, anxiety, and neuropathy -Continue Remeron  7.5 mg QHS for depression, anxiety, sleep, and appetite -Continue Agitation Protocol: Haldol /Benadryl /Zyprexa      Nicotine  Dependence: -Continue Nicotine  Patch 14 mg daily     HTN:  -Continue Amlodipine  5 mg daily -Continue Lisinopril -hydrochlorothiazide  20-12.5 mg daily     -Continue Albuterol  2 puffs q6 PRN -Continue Metformin  1000 mg BID -Continue PRN's: Tylenol , Maalox, Atarax , Milk of Magnesia, Trazodone    --  The risks/benefits/side-effects/alternatives to medications were discussed in detail with the patient and time was given for questions. The patient consents to medication trials.                --(CMP: WNL except  Tot Prot: 6.0,  CBC: WNL except WBC: 12.1,  Lymph Abs,  TSH: 1.136,  Urine Preg: Neg,  UDS: Cocaine, Oxy, THC positive, A1c: 5.3,  Lipid Panel: WNL except  Trig: 224,  VLDL: 45,  LDL: 104, EKG: NSR w/ Qtc: 439)              -- Encouraged patient to participate in unit milieu and in scheduled group therapies              -- Short Term Goals: Ability to identify changes in lifestyle to reduce recurrence of condition will improve, Ability to verbalize feelings will improve, Ability to disclose and discuss suicidal ideas, Ability to demonstrate self-control will improve, Ability to identify and develop  effective coping behaviors will improve, Ability to maintain clinical measurements within normal limits will improve, Compliance with prescribed medications will improve, and Ability to identify triggers associated with substance abuse/mental health issues will improve             -- Long Term Goals: Improvement in symptoms so as ready for discharge   Safety and Monitoring:             -- Voluntary admission to inpatient psychiatric unit for safety, stabilization and treatment             -- Daily contact with patient to assess and evaluate symptoms and progress in treatment             -- Patient's case to be discussed in multi-disciplinary team meeting             -- Observation Level : q15 minute checks             -- Vital signs:  q12 hours             -- Precautions: suicide, elopement, and assault  Discharge Planning:              -- Social work and case management to assist with discharge planning and identification of hospital follow-up needs prior to discharge             -- Estimated LOS: 4-6 more days             -- Discharge Concerns: Need to establish a safety plan; Medication compliance and effectiveness             -- Discharge Goals: Return home with outpatient referrals for mental health follow-up including medication management/psychotherapy    Marsa RAMAN  Nancy Manuele, DO 11/29/2023, 10:54 AM

## 2023-11-29 NOTE — Group Note (Signed)
 Date:  11/29/2023 Time:  11:43 AM  Group Topic/Focus:  Exploring control, boundaries, and coping mechanisms  Patients engaged in a creative exercise where they each drew on a blank piece of paper, then passed their papers around, adding to each other's drawings. This activity helped explore the experience of sharing control. Afterward, the group reflected on how it felt to let others contribute to their drawings, and how this mirrors real-life experiences of navigating change.  The conversation then focused on boundaries, with worksheets and scenarios sparking a discussion on healthy vs. unhealthy boundaries and coping strategies. Patients explored how these concepts connect to personal control and how they can positively impact relationships and daily life. The session ended with a focus on how setting healthy boundaries and using positive coping strategies can help restore a sense of control and emotional well-being.  Participation Level:  Did Not Attend  Participation Quality:  N/A  Affect:  N/A  Cognitive:  N/A  Insight: None  Engagement in Group:  None  Modes of Intervention:  N/A  Additional Comments:  Patient did not attend this group.  Kristi HERO Korben Carcione 11/29/2023, 11:43 AM

## 2023-11-29 NOTE — Progress Notes (Signed)
 Tour of Duty:  Prentice JINNY Angle, RN, 11/29/23, Tour of Duty: 0700-1900  SI/HI/AVH: [place] SI, no plan or intent. Denies HI/AVH.  Self-Reported   Mood: Negative  Anxiety: Endorses Depression: Endorses Irritability: Denies, but Observable  Broset  Violence Prevention Guidelines *See Row Information*: Small Violence Risk interventions implemented   LBM  Last BM Date : 11/29/23   Pain: present, PRN provided (see MAR)  Patient Refusals (including Rx): No  Shift Summary: Patient observed to be oversleep in room. Patient withdrawn to self. Patient able to make needs known. Patient observed to engage appropriately with staff and peers. Patient taking medications as prescribed. This shift, no PRN medication requested or required. No observed or reported side effects to medication. No observed or reported agitation, aggression, or other acute emotional distress. No observed or reported physical abnormalities or concerns.  Last Vitals  Vitals Weight: 67.1 kg Temp: 98.8 F (37.1 C) Temp Source: Oral Pulse Rate: 63 Resp: 18 BP: (!) 148/76 Patient Position: (not recorded)  Admission Type  Psych Admission Type (Psych Patients Only) Admission Status: Voluntary Date 72 hour document signed : (not recorded) Time 72 hour document signed : (not recorded) Provider Notified (First and Last Name) (see details for LINK to note): (not recorded)   Psychosocial Assessment  Psychosocial Assessment Patient Complaints: Anxiety, Self-harm thoughts Eye Contact: Brief, Avertive Facial Expression: Flat Affect: Depressed Speech: Logical/coherent Interaction: Guarded Motor Activity: Unsteady Appearance/Hygiene: Unremarkable Behavior Characteristics: Anxious, Guarded Mood: Depressed, Anxious   Aggressive Behavior  Targets: (not recorded)   Thought Process  Thought Process Coherency: Within Defined Limits Content: Ambivalence Delusions: None reported or observed Perception: Within  Defined Limits Hallucination: None reported or observed Judgment: Limited Confusion: None  Danger to Self/Others  Danger to Self Current suicidal ideation?: Passive Description of Suicide Plan: (not recorded) Self-Injurious Behavior: 1 Agreement Not to Harm Self: (not recorded) Description of Agreement: (not recorded) Danger to Others: None reported or observed

## 2023-11-29 NOTE — Progress Notes (Signed)
(  Sleep Hours) - (Any PRNs that were needed, meds refused, or side effects to meds)-  PRN Tylenol  and PRN Trazodone  (Any disturbances and when (visitation, over night)- None (Concerns raised by the patient)-  Pt reports that she typically takes 400 mg of Gabapentin  for her neuropathy and wanted to be restarted on that medication. Pt also requesting to be restarted/started on home medication of muscle relaxers for her chronic hip pain. Informed pt that Provider would be notified about this request. (SI/HI/AVH)- Denies. Contracts for safety

## 2023-11-29 NOTE — Progress Notes (Signed)
   11/28/23 2040  Psych Admission Type (Psych Patients Only)  Admission Status Voluntary  Psychosocial Assessment  Patient Complaints Irritability;Sadness;Tension  Eye Contact Avoids  Facial Expression Flat  Affect Depressed  Speech Soft  Interaction Avoidant;Defensive  Motor Activity Other (Comment) (WNL)  Appearance/Hygiene Poor hygiene  Behavior Characteristics Irritable;Anxious  Mood Depressed;Preoccupied  Thought Process  Coherency WDL  Content Blaming others  Delusions None reported or observed  Perception WDL  Hallucination None reported or observed  Judgment Poor  Confusion Mild  Danger to Self  Current suicidal ideation? Passive  Agreement Not to Harm Self Yes  Description of Agreement Notify Staff  Danger to Others  Danger to Others None reported or observed

## 2023-11-29 NOTE — Plan of Care (Signed)
   Problem: Education: Goal: Emotional status will improve Outcome: Not Progressing Goal: Mental status will improve Outcome: Not Progressing

## 2023-11-29 NOTE — BH Assessment (Signed)
(  Sleep Hours) - 8.5 (Any PRNs that were needed, meds refused, or side effects to meds)-  (Any disturbances and when (visitation, over night)- None (Concerns raised by the patient)- None (SI/HI/AVH)- Denies

## 2023-11-29 NOTE — Group Note (Signed)
 LCSW Group Therapy Note   Group Date: 11/29/2023 Start Time: 1300 End Time: 1400   Type of Therapy and Topic:  Group Therapy: Boundaries  Participation Level:  Did Not Attend  Description of Group: This group will address the use of boundaries in their personal lives. Patients will explore why boundaries are important, the difference between healthy and unhealthy boundaries, and negative and postive outcomes of different boundaries and will look at how boundaries can be crossed.  Patients will be encouraged to identify current boundaries in their own lives and identify what kind of boundary is being set. Facilitators will guide patients in utilizing problem-solving interventions to address and correct types boundaries being used and to address when no boundary is being used. Understanding and applying boundaries will be explored and addressed for obtaining and maintaining a balanced life. Patients will be encouraged to explore ways to assertively make their boundaries and needs known to significant others in their lives, using other group members and facilitator for role play, support, and feedback.  Therapeutic Goals:  1.  Patient will identify areas in their life where setting clear boundaries could be  used to improve their life.  2.  Patient will identify signs/triggers that a boundary is not being respected. 3.  Patient will identify two ways to set boundaries in order to achieve balance in  their lives: 4.  Patient will demonstrate ability to communicate their needs and set boundaries  through discussion and/or role plays  Summary of Patient Progress:  Pt was invited to group but did not attend  Therapeutic Modalities:   Cognitive Behavioral Therapy Solution-Focused Therapy  Gearold Wainer, LCSWA 11/29/2023  2:49 PM

## 2023-11-29 NOTE — Group Note (Signed)
 Date:  11/29/2023 Time:  10:58 AM  Group Topic/Focus:  Goals Group:   The focus of this group is to help patients establish daily goals to achieve during treatment and discuss how the patient can incorporate goal setting into their daily lives to aide in recovery.    Participation Level:  Did Not Attend  Participation Quality:  N/A  Affect:  N/A  Cognitive:  N/A  Insight: None  Engagement in Group:  None  Modes of Intervention:  N/A  Additional Comments:  Patient did not attend goals group.  Kristi HERO Elis Rawlinson 11/29/2023, 10:58 AM

## 2023-11-29 NOTE — Progress Notes (Signed)
 Adult Psychoeducational Group Note  Date:  11/29/2023 Time:  9:17 PM  Group Topic/Focus:  Wrap-Up Group:   The focus of this group is to help patients review their daily goal of treatment and discuss progress on daily workbooks.  Participation Level:  Active  Participation Quality:  Appropriate  Affect:  Appropriate  Cognitive:  Appropriate  Insight: Appropriate  Engagement in Group:  Engaged  Modes of Intervention:  Discussion  Additional Comments:  Pt stated her goal for the day was to get up and move aroung.  Pt met goal.  Rachel Knapp 11/29/2023, 9:17 PM

## 2023-11-30 MED ORDER — GABAPENTIN 100 MG PO CAPS
100.0000 mg | ORAL_CAPSULE | Freq: Three times a day (TID) | ORAL | Status: DC
  Administered 2023-11-30 – 2023-12-05 (×14): 100 mg via ORAL
  Filled 2023-11-30 (×14): qty 1

## 2023-11-30 MED ORDER — GABAPENTIN 100 MG PO CAPS: 200.0000 mg | ORAL_CAPSULE | Freq: Three times a day (TID) | ORAL | Status: DC

## 2023-11-30 MED ORDER — METHOCARBAMOL 500 MG PO TABS
500.0000 mg | ORAL_TABLET | Freq: Two times a day (BID) | ORAL | Status: DC
  Administered 2023-11-30 – 2023-12-05 (×10): 500 mg via ORAL
  Filled 2023-11-30 (×11): qty 1

## 2023-11-30 NOTE — [facility] Group Notes (Signed)
 [facility] Group Notes:  (Nursing/MHT/Case Management/Adjunct)  Date:  11/30/2023  Time:  11:11 PM  Type of Therapy:  Wrap- up group  Participation Level:  Did Not Attend  Participation Quality:    Affect:    Cognitive:    Insight:    Engagement in Group:    Modes of Intervention:    Summary of Progress/Problems: Refused to attend group.  Rachel Knapp 11/30/2023, 11:11 PM

## 2023-11-30 NOTE — Progress Notes (Signed)
 Tour of Duty:  Prentice JINNY Angle, RN, 11/30/23, Tour of Duty: 0700-1900  SI/HI/AVH: Denies  Self-Reported   Mood: Negative  Anxiety: Endorses Depression: Denies, but Observable Irritability: Denies  Broset  Violence Prevention Guidelines *See Row Information*: Small Violence Risk interventions implemented   LBM  Last BM Date : 11/30/23   Pain: present, interventions include: scheduled Rx, rest  Patient Refusals (including Rx): Yes, including morning dose of gabapentin   Shift Summary: Patient observed to be isolative to room and calm on unit. Patient able to make needs known. Patient observed to engage appropriately with staff and peers. Patient taking medications as prescribed. This shift, no PRN medication requested or required. No observed or reported side effects to medication. No observed or reported agitation, aggression, or other acute emotional distress. No observed or reported physical abnormalities or concerns.  Last Vitals  Vitals Weight: 67.1 kg Temp: 98.8 F (37.1 C) Temp Source: Oral Pulse Rate: 65 Resp: 18 BP: (!) 141/91 Patient Position: (not recorded)  Admission Type  Psych Admission Type (Psych Patients Only) Admission Status: Voluntary Date 72 hour document signed : (not recorded) Time 72 hour document signed : (not recorded) Provider Notified (First and Last Name) (see details for LINK to note): (not recorded)   Psychosocial Assessment  Psychosocial Assessment Patient Complaints: Anxiety Eye Contact: Brief, Avertive Facial Expression: Flat Affect: Depressed Speech: Logical/coherent Interaction: Guarded Motor Activity: Unsteady Appearance/Hygiene: Unremarkable Behavior Characteristics: Cooperative, Anxious Mood: Anxious   Aggressive Behavior  Targets: (not recorded)   Thought Process  Thought Process Coherency: Within Defined Limits Content: Ambivalence Delusions: None reported or observed Perception: Within Defined  Limits Hallucination: None reported or observed Judgment: Limited Confusion: None  Danger to Self/Others  Danger to Self Current suicidal ideation?: Passive Description of Suicide Plan: (not recorded) Self-Injurious Behavior: 1 Agreement Not to Harm Self: (not recorded) Description of Agreement: (not recorded) Danger to Others: None reported or observed

## 2023-11-30 NOTE — Group Note (Signed)
 Date:  11/30/2023 Time:  7:24 PM  Group Topic/Focus:  Group used a non- competitive card game to build mindful listening skills, and encourage acceptance and understanding of oneself and peers. Patients share, fostering empathy and personal growth in a supportive environment.    Participation Level:  Active  Participation Quality:  Appropriate and Attentive  Affect:  Appropriate  Cognitive:  Alert and Appropriate  Insight: Appropriate and Good  Engagement in Group:  Engaged and Supportive  Modes of Intervention:  Activity, Discussion, Exploration, Rapport Building, Socialization, and Support Additional Comments:    Berwyn GORMAN Acosta 11/30/2023, 7:24 PM

## 2023-11-30 NOTE — Progress Notes (Addendum)
 [facility] MD Progress Note  11/30/2023 12:40 PM TYREA FROBERG  MRN:  [id number] Subjective:   Rachel Knapp is a 53 yr old female who presented on 9/25 to [facility] with worsening depression and SI w/ a plan (cut wrists), she was admitted to [facility] on 9/26. PPHx is significant for Depression, Anxiety, PTSD, and Polysubstance Abuse, and 1 Suicide Attempt (hanging- 10/2017), and 1 Prior Psychiatric Hospitalization [facility] 10/2017), and no history of Self Injurious Behavior.    Case was discussed in the multidisciplinary team. MAR was reviewed and patient was compliant with medications.  She did received PRN Tylenol  x2 and Trazodone  yesterday.   Psychiatric Team made the following recommendations yesterday: -Continue Cymbalta  20 mg daily for depression, anxiety, and neuropathy -Continue Remeron  7.5 mg QHS for depression, anxiety, sleep, and appetite    On interview today patient reports she slept fair last night, she reports she did have 1 nightmare last night.  She reports her appetite is doing fair and improving.  She reports continuing to have SI but it is less intense and improving.  She reports no HI or AVH.  She reports no Paranoia or Ideas of Reference.  She reports no issues with her medications.  Discussed with her that we would continue with the planned increase in Cymbalta  and she was agreeable.  She did request restarting her Robaxin  and discussed we could restart at a lower dose and she was agreeable.  She reports no other concerns at present.    Principal Problem: MDD (major depressive disorder), recurrent severe, without psychosis (HCC) Diagnosis: Principal Problem:   MDD (major depressive disorder), recurrent severe, without psychosis (HCC) Active Problems:   GAD (generalized anxiety disorder)   PTSD (post-traumatic stress disorder)   Polysubstance abuse (HCC)  Total Time spent with patient:  I personally spent 35 minutes on the unit in direct patient care. The direct patient care time  included face-to-face time with the patient, reviewing the patient's chart, communicating with other professionals, and coordinating care.    Past Psychiatric History:  Depression, Anxiety, PTSD, and Polysubstance Abuse, and 1 Suicide Attempt (hanging- 10/2017), and 1 Prior Psychiatric Hospitalization [facility] 10/2017), and no history of Self Injurious Behavior.   Past Medical History:  Past Medical History:  Diagnosis Date   Anxiety    Arthritis    Asthma    Depression    GERD (gastroesophageal reflux disease)    Hypertension    Neuromuscular disorder (HCC)    neuropathy    Past Surgical History:  Procedure Laterality Date   TONSILLECTOMY     TOOTH EXTRACTION N/A 06/14/2022   Procedure: DENTAL RESTORATION/EXTRACTIONS;  Surgeon: Sheryle Hamilton, DMD;  Location: [facility] OR;  Service: Oral Surgery;  Laterality: N/A;   tubes in ears     TYMPANOSTOMY TUBE PLACEMENT     Family History: History reviewed. No pertinent family history. Family Psychiatric  History:  Mother- Depression, Substance Abuse Paternal Uncle- Schizophrenia Maternal Uncle- Schizophrenia Brother- THC Abuse Multiple Members substance abuse No Known Suicides  Social History:  Social History   Substance and Sexual Activity  Alcohol Use No     Social History   Substance and Sexual Activity  Drug Use Yes   Types: Cocaine, Marijuana, Crack cocaine    Social History   Socioeconomic History   Marital status: Single    Spouse name: Not on file   Number of children: Not on file   Years of education: Not on file   Highest education level: Not on  file  Occupational History   Not on file  Tobacco Use   Smoking status: Every Day    Current packs/day: 0.50    Types: Cigarettes   Smokeless tobacco: Never  Vaping Use   Vaping status: Never Used  Substance and Sexual Activity   Alcohol use: No   Drug use: Yes    Types: Cocaine, Marijuana, Crack cocaine   Sexual activity: Yes    Birth control/protection: None   Other Topics Concern   Not on file  Social History Narrative   Not on file   Social Drivers of Health   Financial Resource Strain: Not on file  Food Insecurity: Food Insecurity Present (11/28/2023)   Hunger Vital Sign    Worried About Running Out of Food in the Last Year: Often true    Ran Out of Food in the Last Year: Often true  Transportation Needs: Unmet Transportation Needs (11/28/2023)   PRAPARE - Administrator, Civil Service (Medical): Yes    Lack of Transportation (Non-Medical): Yes  Physical Activity: Not on file  Stress: Not on file  Social Connections: Not on file   Additional Social History:                         Sleep: Fair Estimated Sleeping Duration (Last 24 Hours): 9.00-9.75 hours  Appetite:  Fair  Current Medications: Current Facility-Administered Medications  Medication Dose Route Frequency Provider Last Rate Last Admin   acetaminophen  (TYLENOL ) tablet 650 mg  650 mg Oral Q6H PRN Valeda Corzine S, DO   650 mg at 11/29/23 2107   albuterol  (VENTOLIN  HFA) 108 (90 Base) MCG/ACT inhaler 2 puff  2 puff Inhalation Q6H PRN Kirklin Mcduffee S, DO       alum & mag hydroxide-simeth (MAALOX/MYLANTA) 200-200-20 MG/5ML suspension 15 mL  15 mL Oral Q6H PRN Layani Foronda S, DO       amLODipine  (NORVASC ) tablet 5 mg  5 mg Oral Daily Lakeia Bradshaw S, DO   5 mg at 11/30/23 1207   haloperidol  (HALDOL ) tablet 5 mg  5 mg Oral TID PRN Trudy Carwin, NP       And   diphenhydrAMINE  (BENADRYL ) capsule 50 mg  50 mg Oral TID PRN Trudy Carwin, NP       DULoxetine  (CYMBALTA ) DR capsule 30 mg  30 mg Oral Daily Kaeya Schiffer S, DO   30 mg at 11/30/23 1207   gabapentin  (NEURONTIN ) capsule 200 mg  200 mg Oral TID Tafari Humiston S, DO       lisinopril  (ZESTRIL ) tablet 20 mg  20 mg Oral Daily Landy, Terri L, RPH   20 mg at 11/30/23 1207   And   hydrochlorothiazide  (HYDRODIURIL ) tablet 12.5 mg  12.5 mg Oral Daily Landy Veva CROME, RPH    12.5 mg at 11/30/23 1207   hydrOXYzine  (ATARAX ) tablet 25 mg  25 mg Oral TID PRN Raliegh Marsa RAMAN, DO       magnesium  hydroxide (MILK OF MAGNESIA) suspension 30 mL  30 mL Oral Daily PRN Kaelynne Christley S, DO       metFORMIN  (GLUCOPHAGE ) tablet 1,000 mg  1,000 mg Oral BID WC Roanne Haye S, DO   1,000 mg at 11/30/23 1207   methocarbamol  (ROBAXIN ) tablet 500 mg  500 mg Oral BID Camden Mazzaferro S, DO       mirtazapine  (REMERON ) tablet 7.5 mg  7.5 mg Oral QHS Dema Timmons S, DO   7.5 mg at  11/29/23 2108   nicotine  (NICODERM CQ  - dosed in mg/24 hours) patch 14 mg  14 mg Transdermal Daily Trudy Carwin, NP   14 mg at 11/30/23 1207   OLANZapine  (ZYPREXA ) injection 10 mg  10 mg Intramuscular TID PRN Trudy Carwin, NP       OLANZapine  (ZYPREXA ) injection 5 mg  5 mg Intramuscular TID PRN Trudy Carwin, NP       traZODone  (DESYREL ) tablet 50 mg  50 mg Oral QHS PRN Raliegh Marsa RAMAN, DO   50 mg at 11/29/23 2108    Lab Results:  Results for orders placed or performed during the hospital encounter of 11/28/23 (from the past 48 hours)  Lipid panel     Status: Abnormal   Collection Time: 11/29/23  6:45 AM  Result Value Ref Range   Cholesterol 192 0 - 200 mg/dL    Comment:        ATP III CLASSIFICATION:  <200     mg/dL   Desirable  799-760  mg/dL   Borderline High  >=759    mg/dL   High           Triglycerides 224 (H) <150 mg/dL   HDL 43 >59 mg/dL   Total CHOL/HDL Ratio 4.5 RATIO   VLDL 45 (H) 0 - 40 mg/dL   LDL Cholesterol 895 (H) 0 - 99 mg/dL    Comment:        Total Cholesterol/HDL:CHD Risk Coronary Heart Disease Risk Table                     Men   Women  1/2 Average Risk   3.4   3.3  Average Risk       5.0   4.4  2 X Average Risk   9.6   7.1  3 X Average Risk  23.4   11.0        Use the calculated Patient Ratio above and the CHD Risk Table to determine the patient's CHD Risk.        ATP III CLASSIFICATION (LDL):  <100     mg/dL   Optimal  899-870  mg/dL    Near or Above                    Optimal  130-159  mg/dL   Borderline  839-810  mg/dL   High  >809     mg/dL   Very High Performed at [facility], [address], [place], [place] [address]   Hemoglobin A1c     Status: None   Collection Time: 11/29/23  6:45 AM  Result Value Ref Range   Hgb A1c MFr Bld 5.3 4.8 - 5.6 %    Comment: (NOTE) Diagnosis of Diabetes The following HbA1c ranges recommended by the American Diabetes Association (ADA) may be used as an aid in the diagnosis of diabetes mellitus.  Hemoglobin             Suggested A1C NGSP%              Diagnosis  <5.7                   Non Diabetic  5.7-6.4                Pre-Diabetic  >6.4                   Diabetic  <7.0  Glycemic control for                       adults with diabetes.     Mean Plasma Glucose 105.41 mg/dL    Comment: Performed at [facility], [address] [place], [place] [address]    Blood Alcohol level:  Lab Results  Component Value Date   [facility] <15 11/27/2023   ETH <10 10/31/2017    Metabolic Disorder Labs: Lab Results  Component Value Date   HGBA1C 5.3 11/29/2023   MPG 105.41 11/29/2023   No results found for: PROLACTIN Lab Results  Component Value Date   CHOL 192 11/29/2023   TRIG 224 (H) 11/29/2023   HDL 43 11/29/2023   CHOLHDL 4.5 11/29/2023   VLDL 45 (H) 11/29/2023   LDLCALC 104 (H) 11/29/2023    Physical Findings: AIMS:  ,  ,  ,  ,  ,  ,   CIWA:    COWS:     Musculoskeletal: Strength & Muscle Tone: within normal limits Gait & Station: normal Patient leans: N/A  Psychiatric Specialty Exam:  Presentation  General Appearance:  Appropriate for Environment; Casual  Eye Contact: Good  Speech: Clear and Coherent; Normal Rate  Speech Volume: Normal  Handedness: Right   Mood and Affect  Mood: Dysphoric; Anxious  Affect: Congruent; Depressed   Thought Process  Thought Processes: Coherent; Goal  Directed  Descriptions of Associations:Intact  Orientation:Full (Time, Place and Person)  Thought Content:Logical; WDL  History of Schizophrenia/Schizoaffective disorder:No  Duration of Psychotic Symptoms:No data recorded Hallucinations:Hallucinations: None  Ideas of Reference:None  Suicidal Thoughts:Suicidal Thoughts: Yes, Active (continuing to improve)  Homicidal Thoughts:Homicidal Thoughts: No   Sensorium  Memory: Immediate Fair; Recent Fair  Judgment: Intact  Insight: Present   Executive Functions  Concentration: Fair  Attention Span: Fair  Recall: Fair  Fund of Knowledge: Fair  Language: Good   Psychomotor Activity  Psychomotor Activity: Psychomotor Activity: Normal   Assets  Assets: Communication Skills; Desire for Improvement; Resilience   Sleep  Sleep: Sleep: Fair    Physical Exam: Physical Exam Vitals and nursing note reviewed.  Constitutional:      General: She is not in acute distress.    Appearance: Normal appearance. She is normal weight. She is not ill-appearing or toxic-appearing.  HENT:     Head: Normocephalic and atraumatic.  Pulmonary:     Effort: Pulmonary effort is normal.  Musculoskeletal:        General: Normal range of motion.  Neurological:     General: No focal deficit present.     Mental Status: She is alert.    Review of Systems  Respiratory:  Negative for cough and shortness of breath.   Cardiovascular:  Negative for chest pain.  Gastrointestinal:  Negative for abdominal pain, constipation, diarrhea, nausea and vomiting.  Neurological:  Negative for dizziness, weakness and headaches.  Psychiatric/Behavioral:  Positive for depression and suicidal ideas (improving). Negative for hallucinations. The patient is nervous/anxious.    Blood pressure (!) 141/91, pulse 65, temperature 98.8 F (37.1 C), temperature source Oral, resp. rate 18, height 5' 5 (1.651 m), weight 67.1 kg, last menstrual period  12/17/2015, SpO2 99%. Body mass index is 24.63 kg/m.   Treatment Plan Summary: Daily contact with patient to assess and evaluate symptoms and progress in treatment and Medication management   Rachel Knapp is a 53 yr old female who presented on 9/25 to [facility] with worsening depression and SI w/  a plan (cut wrists), she was admitted to [facility] on 9/26.  PPHx is significant for Depression, Anxiety, PTSD, and Polysubstance Abuse, and 1 Suicide Attempt (hanging- 10/2017), and 1 Prior Psychiatric Hospitalization [facility] 10/2017), and no history of Self Injurious Behavior.      Syniyah is continuing to have depression and anxiety but is showing some improvements.  We will further increase her Cymbalta .  Also to address her chronic pain we will restart her Gabapentin  and restart her Robaxin .  We will continue to monitor.      MDD, Recurrent, Severe, w/out Psychosis  GAD  PTSD: -Increase Cymbalta  to 30 mg daily for depression, anxiety, and neuropathy -Continue Remeron  7.5 mg QHS for depression, anxiety, sleep, and appetite -Restart Gabapentin  100 mg TID for anxiety and pain -Continue Agitation Protocol: Haldol /Benadryl /Zyprexa      Nicotine  Dependence: -Continue Nicotine  Patch 14 mg daily     HTN:  -Continue Amlodipine  5 mg daily -Continue Lisinopril -hydrochlorothiazide  20-12.5 mg daily     -Restart Robaxin  500 mg BID for chronic pain -Continue Albuterol  2 puffs q6 PRN -Continue Metformin  1000 mg BID -Continue PRN's: Tylenol , Maalox, Atarax , Milk of Magnesia, Trazodone    --  The risks/benefits/side-effects/alternatives to medications were discussed in detail with the patient and time was given for questions. The patient consents to medication trials.                --(CMP: WNL except  Tot Prot: 6.0,  CBC: WNL except WBC: 12.1,  Lymph Abs,  TSH: 1.136,  Urine Preg: Neg,  UDS: Cocaine, Oxy, THC positive, A1c: 5.3,  Lipid Panel: WNL except  Trig: 224,  VLDL: 45,  LDL: 104, EKG: NSR w/ Qtc: 439)               -- Encouraged patient to participate in unit milieu and in scheduled group therapies              -- Short Term Goals: Ability to identify changes in lifestyle to reduce recurrence of condition will improve, Ability to verbalize feelings will improve, Ability to disclose and discuss suicidal ideas, Ability to demonstrate self-control will improve, Ability to identify and develop effective coping behaviors will improve, Ability to maintain clinical measurements within normal limits will improve, Compliance with prescribed medications will improve, and Ability to identify triggers associated with substance abuse/mental health issues will improve             -- Long Term Goals: Improvement in symptoms so as ready for discharge   Safety and Monitoring:             -- Voluntary admission to inpatient psychiatric unit for safety, stabilization and treatment             -- Daily contact with patient to assess and evaluate symptoms and progress in treatment             -- Patient's case to be discussed in multi-disciplinary team meeting             -- Observation Level : q15 minute checks             -- Vital signs:  q12 hours             -- Precautions: suicide, elopement, and assault  Discharge Planning:              -- Social work and case management to assist with discharge planning and identification of hospital follow-up needs prior to discharge             --  Estimated LOS: 3-5 more days             -- Discharge Concerns: Need to establish a safety plan; Medication compliance and effectiveness             -- Discharge Goals: Return home with outpatient referrals for mental health follow-up including medication management/psychotherapy    Marsa GORMAN Rosser, DO 11/30/2023, 12:40 PM

## 2023-11-30 NOTE — Progress Notes (Incomplete)
 D) Pt received calm, visible, participating in milieu, and in no acute distress. Pt A & O x4. Pt denies SI, HI, A/ V H, depression, anxiety and pain at this time. A) Pt encouraged to drink fluids. Pt encouraged to come to staff with needs. Pt encouraged to attend and participate in groups. Pt encouraged to set reachable goals.  R) Pt remained safe on unit, in no acute distress, will continue to assess.   Pt reports prefering her robaxin  at an earlier time. Will report to day nurse

## 2023-11-30 NOTE — Plan of Care (Signed)
   Problem: Education: Goal: Emotional status will improve Outcome: Progressing Goal: Mental status will improve Outcome: Progressing

## 2023-11-30 NOTE — Plan of Care (Signed)
  Problem: Activity: Goal: Interest or engagement in activities will improve Outcome: Progressing   Problem: Safety: Goal: Periods of time without injury will increase Outcome: Progressing

## 2023-11-30 NOTE — Progress Notes (Signed)
 D) Pt received calm, visible, participating in milieu, and in no acute distress. Pt A & O x4. Pt denies SI, HI, A/ V H, depression, anxiety and pain at this time. A) Pt encouraged to drink fluids. Pt encouraged to come to staff with needs. Pt encouraged to attend and participate in groups. Pt encouraged to set reachable goals.  R) Pt remained safe on unit, in no acute distress, will continue to assess.   Pt reports prefering her robaxin  at an earlier time. Will report to day nurse    11/30/23 2100  Psych Admission Type (Psych Patients Only)  Admission Status Voluntary  Psychosocial Assessment  Patient Complaints None  Eye Contact Fair  Facial Expression Flat  Affect Appropriate to circumstance  Speech Logical/coherent  Interaction Minimal  Motor Activity Unsteady  Appearance/Hygiene Unremarkable  Behavior Characteristics Cooperative  Mood Euthymic  Thought Process  Coherency WDL  Content WDL  Delusions None reported or observed  Perception WDL  Hallucination None reported or observed  Judgment Limited  Confusion None  Danger to Self  Current suicidal ideation? Denies  Agreement Not to Harm Self Yes  Description of Agreement verbal  Danger to Others  Danger to Others None reported or observed

## 2023-11-30 NOTE — Group Note (Signed)
 Date:  11/30/2023 Time:  10:48 AM  Group Topic/Focus:  Goals Group:   The focus of this group is to help patients establish daily goals to achieve during treatment and discuss how the patient can incorporate goal setting into their daily lives to aide in recovery.    Participation Level:  Did Not Attend  Participation Quality:  Did Not Attend  Affect:  Did Not Attend  Cognitive:  Did Not Attend  Insight: None  Engagement in Group:  Did Not Attend  Modes of Intervention:  Did Not Attend  Additional Comments:  Did Not Attend  Rachel Knapp 11/30/2023, 10:48 AM

## 2023-12-01 MED ORDER — MIRTAZAPINE 15 MG PO TABS
15.0000 mg | ORAL_TABLET | Freq: Every day | ORAL | Status: DC
  Administered 2023-12-01 – 2023-12-04 (×4): 15 mg via ORAL
  Filled 2023-12-01 (×4): qty 1

## 2023-12-01 MED ORDER — LOPERAMIDE HCL 2 MG PO CAPS
2.0000 mg | ORAL_CAPSULE | ORAL | Status: DC | PRN
  Administered 2023-12-01 – 2023-12-04 (×6): 2 mg via ORAL
  Filled 2023-12-01 (×6): qty 1

## 2023-12-01 NOTE — Group Note (Unsigned)
 Date:  12/02/2023 Time:  4:41 AM  Group Topic/Focus:  Wrap-Up Group:   The focus of this group is to help patients review their daily goal of treatment and discuss progress on daily workbooks. Alcoholics Anonymous (AA) Meeting   Participation Level:  Did Not Attend  Participation Quality:  N/A  Affect:  N/A  Cognitive:  N/A  Insight: None  Engagement in Group:  N/A  Modes of Intervention:  N/A  Additional Comments:  Patient did not attend  Eward Mace 12/02/2023, 4:41 AM

## 2023-12-01 NOTE — Progress Notes (Addendum)
 D: Patient is alert, oriented, and cooperative. Denies SI, HI, AVH, and verbally contracts for safety. Patient reports she slept poorly last night with sleeping medication. Patient reports her appetite as fair, energy level as low, and concentration as good. Patient rates her depression 7/10, hopelessness 3/10, and anxiety 8/10. Patient reports back pain, anxiety, and diarrhea.    A: Scheduled medications administered per MD order. PRN tylenol , hydroxyzine , and imodium  administered. Support provided. Patient educated on safety on the unit and medications. Routine safety checks every 15 minutes. Patient stated understanding to tell nurse about any new physical symptoms. Patient understands to tell staff of any needs.     R: No adverse drug reactions noted. Patient remains safe at this time and will continue to monitor.    12/01/23 1100  Psych Admission Type (Psych Patients Only)  Admission Status Voluntary  Psychosocial Assessment  Patient Complaints Sleep disturbance  Eye Contact Fair  Facial Expression Flat  Affect Appropriate to circumstance  Speech Logical/coherent  Interaction Assertive  Motor Activity Unsteady  Appearance/Hygiene Unremarkable  Behavior Characteristics Cooperative  Mood Anxious;Depressed  Thought Process  Coherency WDL  Content WDL  Delusions None reported or observed  Perception WDL  Hallucination None reported or observed  Judgment Limited  Confusion None  Danger to Self  Current suicidal ideation? Denies  Self-Injurious Behavior No self-injurious ideation or behavior indicators observed or expressed   Agreement Not to Harm Self Yes  Description of Agreement verbal  Danger to Others  Danger to Others None reported or observed

## 2023-12-01 NOTE — Progress Notes (Signed)
 [facility] MD Progress Note  12/01/2023 2:12 PM Rachel Knapp  MRN:  [id number] Subjective:   Rachel Knapp is a 53 yr old female who presented on 9/25 to [facility] with worsening depression and SI w/ a plan (cut wrists), she was admitted to [facility] on 9/26. PPHx is significant for Depression, Anxiety, PTSD, and Polysubstance Abuse, and 1 Suicide Attempt (hanging- 10/2017), and 1 Prior Psychiatric Hospitalization [facility] 10/2017), and no history of Self Injurious Behavior.    Case was discussed in the multidisciplinary team. MAR was reviewed and patient was compliant with medications.  She did received PRN Trazodone  yesterday.   Psychiatric Team made the following recommendations yesterday: -Increase Cymbalta  to 30 mg daily for depression, anxiety, and neuropathy -Continue Remeron  7.5 mg QHS for depression, anxiety, sleep, and appetite -Restart Gabapentin  100 mg TID for anxiety and pain  -Restart Robaxin  500 mg BID for chronic pain     On interview today patient reports she slept poor last night waking up multiple times.  She reports her appetite is doing fair.  She reports still having SI but that it is improving and is minimal at this point.  She reports no HI or AVH.  She reports no Paranoia or Ideas of Reference.  She reports no issues with her medications.  Discussed further increasing her Remeron  and she was agreeable.  She reports continued soreness/chronic pain.  She reports no other concerns at present.    Principal Problem: MDD (major depressive disorder), recurrent severe, without psychosis (HCC) Diagnosis: Principal Problem:   MDD (major depressive disorder), recurrent severe, without psychosis (HCC) Active Problems:   GAD (generalized anxiety disorder)   PTSD (post-traumatic stress disorder)   Polysubstance abuse (HCC)  Total Time spent with patient:  I personally spent 35 minutes on the unit in direct patient care. The direct patient care time included face-to-face time with the patient,  reviewing the patient's chart, communicating with other professionals, and coordinating care.    Past Psychiatric History:  Depression, Anxiety, PTSD, and Polysubstance Abuse, and 1 Suicide Attempt (hanging- 10/2017), and 1 Prior Psychiatric Hospitalization [facility] 10/2017), and no history of Self Injurious Behavior.   Past Medical History:  Past Medical History:  Diagnosis Date   Anxiety    Arthritis    Asthma    Depression    GERD (gastroesophageal reflux disease)    Hypertension    Neuromuscular disorder (HCC)    neuropathy    Past Surgical History:  Procedure Laterality Date   TONSILLECTOMY     TOOTH EXTRACTION N/A 06/14/2022   Procedure: DENTAL RESTORATION/EXTRACTIONS;  Surgeon: Sheryle Hamilton, DMD;  Location: [facility] OR;  Service: Oral Surgery;  Laterality: N/A;   tubes in ears     TYMPANOSTOMY TUBE PLACEMENT     Family History: History reviewed. No pertinent family history. Family Psychiatric  History:  Mother- Depression, Substance Abuse Paternal Uncle- Schizophrenia Maternal Uncle- Schizophrenia Brother- THC Abuse Multiple Members substance abuse No Known Suicides  Social History:  Social History   Substance and Sexual Activity  Alcohol Use No     Social History   Substance and Sexual Activity  Drug Use Yes   Types: Cocaine, Marijuana, Crack cocaine    Social History   Socioeconomic History   Marital status: Single    Spouse name: Not on file   Number of children: Not on file   Years of education: Not on file   Highest education level: Not on file  Occupational History   Not on file  Tobacco Use   Smoking status: Every Day    Current packs/day: 0.50    Types: Cigarettes   Smokeless tobacco: Never  Vaping Use   Vaping status: Never Used  Substance and Sexual Activity   Alcohol use: No   Drug use: Yes    Types: Cocaine, Marijuana, Crack cocaine   Sexual activity: Yes    Birth control/protection: None  Other Topics Concern   Not on file  Social  History Narrative   Not on file   Social Drivers of Health   Financial Resource Strain: Not on file  Food Insecurity: Food Insecurity Present (11/28/2023)   Hunger Vital Sign    Worried About Running Out of Food in the Last Year: Often true    Ran Out of Food in the Last Year: Often true  Transportation Needs: Unmet Transportation Needs (11/28/2023)   PRAPARE - Administrator, Civil Service (Medical): Yes    Lack of Transportation (Non-Medical): Yes  Physical Activity: Not on file  Stress: Not on file  Social Connections: Not on file   Additional Social History:                         Sleep: Poor Estimated Sleeping Duration (Last 24 Hours): 6.50-7.75 hours  Appetite:  Fair  Current Medications: Current Facility-Administered Medications  Medication Dose Route Frequency Provider Last Rate Last Admin   acetaminophen  (TYLENOL ) tablet 650 mg  650 mg Oral Q6H PRN Ashliegh Parekh S, DO   650 mg at 12/01/23 0815   albuterol  (VENTOLIN  HFA) 108 (90 Base) MCG/ACT inhaler 2 puff  2 puff Inhalation Q6H PRN Tajai Ihde S, DO       alum & mag hydroxide-simeth (MAALOX/MYLANTA) 200-200-20 MG/5ML suspension 15 mL  15 mL Oral Q6H PRN Annison Birchard S, DO       amLODipine  (NORVASC ) tablet 5 mg  5 mg Oral Daily Ynez Eugenio S, DO   5 mg at 12/01/23 0805   haloperidol  (HALDOL ) tablet 5 mg  5 mg Oral TID PRN Trudy Carwin, NP       And   diphenhydrAMINE  (BENADRYL ) capsule 50 mg  50 mg Oral TID PRN Trudy Carwin, NP       DULoxetine  (CYMBALTA ) DR capsule 30 mg  30 mg Oral Daily Ladainian Therien S, DO   30 mg at 12/01/23 9194   gabapentin  (NEURONTIN ) capsule 100 mg  100 mg Oral TID Maleak Brazzel S, DO   100 mg at 12/01/23 1145   lisinopril  (ZESTRIL ) tablet 20 mg  20 mg Oral Daily Landy Veva CROME, RPH   20 mg at 12/01/23 9194   And   hydrochlorothiazide  (HYDRODIURIL ) tablet 12.5 mg  12.5 mg Oral Daily Landy Veva CROME, RPH   12.5 mg at 12/01/23 9193    hydrOXYzine  (ATARAX ) tablet 25 mg  25 mg Oral TID PRN Rachel Marsa RAMAN, DO       loperamide  (IMODIUM ) capsule 2 mg  2 mg Oral PRN Marsha Hillman S, DO   2 mg at 12/01/23 9182   magnesium  hydroxide (MILK OF MAGNESIA) suspension 30 mL  30 mL Oral Daily PRN Rachel Marsa RAMAN, DO       metFORMIN  (GLUCOPHAGE ) tablet 1,000 mg  1,000 mg Oral BID WC Fong Mccarry S, DO   1,000 mg at 12/01/23 9187   methocarbamol  (ROBAXIN ) tablet 500 mg  500 mg Oral BID Jamice Carreno S, DO   500 mg at 12/01/23 [phone]  mirtazapine  (REMERON ) tablet 15 mg  15 mg Oral QHS Lurlie Wigen S, DO       nicotine  (NICODERM CQ  - dosed in mg/24 hours) patch 14 mg  14 mg Transdermal Daily Trudy Carwin, NP   14 mg at 12/01/23 9193   OLANZapine  (ZYPREXA ) injection 10 mg  10 mg Intramuscular TID PRN Trudy Carwin, NP       OLANZapine  (ZYPREXA ) injection 5 mg  5 mg Intramuscular TID PRN Trudy Carwin, NP       traZODone  (DESYREL ) tablet 50 mg  50 mg Oral QHS PRN Kyriana Yankee S, DO   50 mg at 11/30/23 2102    Lab Results:  No results found for this or any previous visit (from the past 48 hours).   Blood Alcohol level:  Lab Results  Component Value Date   [facility] <15 11/27/2023   ETH <10 10/31/2017    Metabolic Disorder Labs: Lab Results  Component Value Date   HGBA1C 5.3 11/29/2023   MPG 105.41 11/29/2023   No results found for: PROLACTIN Lab Results  Component Value Date   CHOL 192 11/29/2023   TRIG 224 (H) 11/29/2023   HDL 43 11/29/2023   CHOLHDL 4.5 11/29/2023   VLDL 45 (H) 11/29/2023   LDLCALC 104 (H) 11/29/2023    Physical Findings: AIMS:  ,  ,  ,  ,  ,  ,   CIWA:    COWS:     Musculoskeletal: Strength & Muscle Tone: within normal limits Gait & Station: normal Patient leans: N/A  Psychiatric Specialty Exam:  Presentation  General Appearance:  Appropriate for Environment; Casual  Eye Contact: Good  Speech: Clear and Coherent; Normal Rate  Speech  Volume: Normal  Handedness: Right   Mood and Affect  Mood: Dysphoric; Anxious  Affect: Congruent   Thought Process  Thought Processes: Coherent; Goal Directed  Descriptions of Associations:Intact  Orientation:Full (Time, Place and Person)  Thought Content:Logical; WDL  History of Schizophrenia/Schizoaffective disorder:No  Duration of Psychotic Symptoms:No data recorded Hallucinations:Hallucinations: None  Ideas of Reference:None  Suicidal Thoughts:Suicidal Thoughts: Yes, Active (minimal)  Homicidal Thoughts:Homicidal Thoughts: No   Sensorium  Memory: Immediate Fair; Recent Fair  Judgment: Intact  Insight: Present   Executive Functions  Concentration: Fair  Attention Span: Fair  Recall: Fair  Fund of Knowledge: Fair  Language: Good   Psychomotor Activity  Psychomotor Activity: Psychomotor Activity: Normal   Assets  Assets: Communication Skills; Desire for Improvement; Resilience   Sleep  Sleep: Sleep: Poor    Physical Exam: Physical Exam Vitals and nursing note reviewed.  Constitutional:      General: She is not in acute distress.    Appearance: Normal appearance. She is normal weight. She is not ill-appearing or toxic-appearing.  HENT:     Head: Normocephalic and atraumatic.  Pulmonary:     Effort: Pulmonary effort is normal.  Neurological:     General: No focal deficit present.     Mental Status: She is alert.    Review of Systems  Respiratory:  Negative for cough and shortness of breath.   Cardiovascular:  Negative for chest pain.  Gastrointestinal:  Negative for abdominal pain, constipation, diarrhea, nausea and vomiting.  Neurological:  Negative for dizziness, weakness and headaches.  Psychiatric/Behavioral:  Positive for depression and suicidal ideas (minimal). Negative for hallucinations. The patient is nervous/anxious.    Blood pressure 122/72, pulse 65, temperature 97.8 F (36.6 C), temperature source  Oral, resp. rate 16, height 5' 5 (1.651 m), weight 67.1  kg, last menstrual period 12/17/2015, SpO2 100%. Body mass index is 24.63 kg/m.   Treatment Plan Summary: Daily contact with patient to assess and evaluate symptoms and progress in treatment and Medication management   Rachel Knapp is a 53 yr old female who presented on 9/25 to [facility] with worsening depression and SI w/ a plan (cut wrists), she was admitted to [facility] on 9/26.  PPHx is significant for Depression, Anxiety, PTSD, and Polysubstance Abuse, and 1 Suicide Attempt (hanging- 10/2017), and 1 Prior Psychiatric Hospitalization [facility] 10/2017), and no history of Self Injurious Behavior.      Rachel Knapp reports poor sleep last night and continued issues with depression and anxiety.  To address this we will increase her Remeron .  She continues to have SI but it continues to improve and is minimal today.  We will continue to monitor.      MDD, Recurrent, Severe, w/out Psychosis  GAD  PTSD: -Continue Cymbalta  30 mg daily for depression, anxiety, and neuropathy -Increase Remeron  to 15 mg QHS for depression, anxiety, sleep, and appetite -Continue Gabapentin  100 mg TID for anxiety and pain -Continue Agitation Protocol: Haldol /Benadryl /Zyprexa      Nicotine  Dependence: -Continue Nicotine  Patch 14 mg daily     HTN:  -Continue Amlodipine  5 mg daily -Continue Lisinopril -hydrochlorothiazide  20-12.5 mg daily     -Continue Robaxin  500 mg BID for chronic pain -Continue Albuterol  2 puffs q6 PRN -Continue Metformin  1000 mg BID -Continue PRN's: Tylenol , Maalox, Atarax , Milk of Magnesia, Trazodone    --  The risks/benefits/side-effects/alternatives to medications were discussed in detail with the patient and time was given for questions. The patient consents to medication trials.                --(CMP: WNL except  Tot Prot: 6.0,  CBC: WNL except WBC: 12.1,  Lymph Abs,  TSH: 1.136,  Urine Preg: Neg,  UDS: Cocaine, Oxy, THC positive, A1c: 5.3,  Lipid  Panel: WNL except  Trig: 224,  VLDL: 45,  LDL: 104, EKG: NSR w/ Qtc: 439)              -- Encouraged patient to participate in unit milieu and in scheduled group therapies              -- Short Term Goals: Ability to identify changes in lifestyle to reduce recurrence of condition will improve, Ability to verbalize feelings will improve, Ability to disclose and discuss suicidal ideas, Ability to demonstrate self-control will improve, Ability to identify and develop effective coping behaviors will improve, Ability to maintain clinical measurements within normal limits will improve, Compliance with prescribed medications will improve, and Ability to identify triggers associated with substance abuse/mental health issues will improve             -- Long Term Goals: Improvement in symptoms so as ready for discharge   Safety and Monitoring:             -- Voluntary admission to inpatient psychiatric unit for safety, stabilization and treatment             -- Daily contact with patient to assess and evaluate symptoms and progress in treatment             -- Patient's case to be discussed in multi-disciplinary team meeting             -- Observation Level : q15 minute checks             -- Vital signs:  q12  hours             -- Precautions: suicide, elopement, and assault  Discharge Planning:              -- Social work and case management to assist with discharge planning and identification of hospital follow-up needs prior to discharge             -- Estimated LOS: 3-4 more days             -- Discharge Concerns: Need to establish a safety plan; Medication compliance and effectiveness             -- Discharge Goals: Return home with outpatient referrals for mental health follow-up including medication management/psychotherapy    Marsa GORMAN Rosser, DO 12/01/2023, 2:12 PM

## 2023-12-01 NOTE — Group Note (Signed)
 Date:  12/01/2023 Time:  11:31 AM  Group Topic/Focus:  Emotional Education:   The focus of this group is to discuss what feelings/emotions are, and how they are experienced.    Participation Level:  Did Not Attend  Participation Quality:  Did Not Attend  Affect:  Did Not Attend  Cognitive:  Did Not Attend  Insight: None  Engagement in Group:  Did Not Attend  Modes of Intervention:  Did Not Attend  Additional Comments:  Did Not Attend  Rachel Knapp 12/01/2023, 11:31 AM

## 2023-12-01 NOTE — Plan of Care (Signed)
  Problem: Education: Goal: Emotional status will improve 12/01/2023 2350 by D'Addio, Sumire Halbleib C, RN Outcome: Progressing Goal: Mental status will improve 12/01/2023 2350 by D'Addio, Aloysious Vangieson C, RN Outcome: Progressing Goal: Verbalization of understanding the information provided will improve 12/01/2023 2350 by D'Addio, Gari Hartsell C, RN Outcome: Progressing Problem: Activity: Goal: Interest or engagement in activities will improve 12/01/2023 2350 by D'Addio, Tawnee Clegg C, RN Outcome: Progressing

## 2023-12-01 NOTE — Plan of Care (Signed)

## 2023-12-01 NOTE — Group Note (Signed)
 Date:  12/01/2023 Time:  3:58 PM  Group Topic/Focus: Grief Market researcher  Emotional Education:   The focus of this group is to discuss what feelings/emotions are, and how they are experienced. Managing Feelings:   The focus of this group is to identify what feelings patients have difficulty handling and develop a plan to handle them in a healthier way upon discharge. Grief is a journey through emotions that can change  from day to day, or even moment to moment. These  twists and turns often feel unsettling--a lot like being  on a roller coaster. Assignment: Label Roller coaster for your unique emotional needs.    Participation Level:  Active  Participation Quality:  Appropriate  Affect:  Appropriate  Cognitive:  Appropriate  Insight: Appropriate  Engagement in Group:  Engaged  Modes of Intervention:  Activity    Rachel Knapp [name] 12/01/2023, 3:58 PM

## 2023-12-01 NOTE — Group Note (Signed)
 Recreation Therapy Group Note   Group Topic:Team Building  Group Date: 12/01/2023 Start Time: 0955 End Time: 1025 Facilitators: Armando Bukhari-McCall, LRT,CTRS Location: [address] Dayroom   Group Topic: Communication, Team Building, Problem Solving  Goal Area(s) Addresses:  Patient will effectively work with peer towards shared goal.  Patient will identify skills used to make activity successful.  Patient will identify how skills used during activity can be applied to reach post d/c goals.   Behavioral Response:   Intervention: STEM Activity- Glass blower/designer  Activity: Tallest [employer]. In teams of 5-6, patients were given 11 craft pipe cleaners. Using the materials provided, patients were instructed to compete again the opposing team(s) to build the tallest free-standing structure from floor level. The activity was timed; difficulty increased by Clinical research associate as Production designer, theatre/television/film continued.  Systematically resources were removed with additional directions for example, placing one arm behind their back, working in silence, and shape stipulations. LRT facilitated post-activity discussion reviewing team processes and necessary communication skills involved in completion. Patients were encouraged to reflect how the skills utilized, or not utilized, in this activity can be incorporated to positively impact support systems post discharge.  Education: Pharmacist, community, Scientist, physiological, Discharge Planning   Education Outcome: Acknowledges education/In group clarification offered/Needs additional education.    Affect/Mood: N/A   Participation Level: Did not attend    Clinical Observations/Individualized Feedback:      Plan: Continue to engage patient in RT group sessions 2-3x/week.   Rachel Knapp, LRT,CTRS  12/01/2023 12:05 PM

## 2023-12-01 NOTE — Progress Notes (Addendum)
(  Sleep Hours) - 6.75 (Any PRNs that were needed, meds refused, or side effects to meds)- 2102 trazodone  for sleep, refused robaxin , reports she would prefer this med earlier (Any disturbances and when (visitation, over night)- none (Concerns raised by the patient)-  none (SI/HI/AVH)- denies all

## 2023-12-01 NOTE — Progress Notes (Signed)
   12/01/23 2057  Psych Admission Type (Psych Patients Only)  Admission Status Voluntary  Psychosocial Assessment  Patient Complaints Anxiety  Eye Contact Fair  Facial Expression Flat  Affect Appropriate to circumstance  Speech Logical/coherent  Interaction Assertive  Motor Activity Unsteady  Appearance/Hygiene Unremarkable  Behavior Characteristics Cooperative  Mood Anxious;Depressed  Thought Process  Coherency WDL  Content WDL  Delusions None reported or observed  Perception WDL  Hallucination None reported or observed  Judgment Limited  Confusion None  Danger to Self  Current suicidal ideation? Denies  Agreement Not to Harm Self Yes  Description of Agreement Verbal  Danger to Others  Danger to Others None reported or observed

## 2023-12-01 NOTE — Group Note (Signed)
 Date:  12/01/2023 Time:  10:35 AM  Group Topic/Focus:  Goals Group:   The focus of this group is to help patients establish daily goals to achieve during treatment and discuss how the patient can incorporate goal setting into their daily lives to aide in recovery.    Participation Level:  Did Not Attend  Participation Quality:  Did Not Attend  Affect:  Did Not Attend  Cognitive:  Did Not Attend  Insight: None  Engagement in Group:  Did Not Attend  Modes of Intervention:  Did Not Attend  Additional Comments:    Avelina DELENA Humphreys 12/01/2023, 10:35 AM

## 2023-12-02 NOTE — Group Note (Signed)
 Date:  12/02/2023 Time:  8:53 PM  Group Topic/Focus:  Wrap-Up Group:   The focus of this group is to help patients review their daily goal of treatment and discuss progress on daily workbooks.    Participation Level:  Active  Participation Quality:  Appropriate and Sharing  Affect:  Appropriate  Cognitive:  Appropriate  Insight: Appropriate  Engagement in Group:  Engaged  Modes of Intervention:  Activity and Socialization  Additional Comments:  Patient activity engaged in wrap up group and shared. Patient shared with group, something good that happened today, something they are looking for to, and a challenge they faced today. Patient also participated in ice breaker group activity after sharing.   Eward Mace 12/02/2023, 8:53 PM

## 2023-12-02 NOTE — Group Note (Signed)
 LCSW Group Therapy Note   Group Date: 12/02/2023 Start Time: 1100 End Time: 1200   Participation:  did not attend  Type of Therapy:  Group Therapy  Topic:  Healing of the Hearts:  A Safe Space for Grief   Objective:  The objective of this class, Healing Hearts: A Safe Space for Grief, is to create a compassionate environment where participants can process their grief, explore different stages of grief, and discover ways to honor their loved ones through personal rituals.  Goals: Provide a safe and supportive space where participants feel comfortable sharing their feelings and experiences of grief without judgment. Educate participants about the stages of grief and emphasize that there is no right way to grieve or a fixed timeline for healing. Introduce the concept of rituals as a means to process grief, allowing individuals to honor their loved ones in a personal and meaningful way.  Summary:  In Healing Hearts: A Safe Space for Grief, we explored the unique and personal journey of grief, emphasizing that everyone experiences it differently. We discussed the five stages of grief (denial, anger, bargaining, depression, and acceptance), with the understanding that grief is not linear. Rituals were introduced as a way to help cope with loss, offering comfort and connection through meaningful actions such as lighting candles or taking memory walks. Participants were encouraged to express their emotions, focus on self-care, and reflect on moments of gratitude for their loved ones, recognizing that healing is a process and there is no timeline for grief.  Therapeutic Modalities: - Elements of CBT:  Cognitive Restructuring - Elements of DBT:  Distress Tolerance, Emotion Regulation   Karrisa Didio O Djon Tith, LCSWA 12/02/2023  12:15 PM

## 2023-12-02 NOTE — Progress Notes (Signed)
   12/02/23 0900  Psych Admission Type (Psych Patients Only)  Admission Status Voluntary  Psychosocial Assessment  Patient Complaints Depression  Eye Contact Fair  Facial Expression Flat  Affect Appropriate to circumstance  Speech Logical/coherent  Interaction Assertive  Motor Activity Unsteady  Appearance/Hygiene Unremarkable  Behavior Characteristics Cooperative  Mood Depressed  Thought Process  Coherency WDL  Content WDL  Delusions None reported or observed  Perception WDL  Hallucination None reported or observed  Judgment Impaired  Confusion None  Danger to Self  Current suicidal ideation? Denies  Danger to Others  Danger to Others None reported or observed

## 2023-12-02 NOTE — Progress Notes (Signed)
 [facility] MD Progress Note  12/02/2023 8:21 AM Rachel Knapp  MRN:  [id number]   Principal Problem: MDD (major depressive disorder), recurrent severe, without psychosis (HCC) Diagnosis: Principal Problem:   MDD (major depressive disorder), recurrent severe, without psychosis (HCC) Active Problems:   GAD (generalized anxiety disorder)   PTSD (post-traumatic stress disorder)   Polysubstance abuse (HCC)  Total Time spent with patient:  I personally spent 35 minutes on the unit in direct patient care. The direct patient care time included face-to-face time with the patient, reviewing the patient's chart, communicating with other professionals, and coordinating care.    Identifying Information and brief psychiatric history: Rachel Knapp is a 53 yr old female who presented on 9/25 to [facility] with worsening depression and SI w/ a plan (cut wrists), she was admitted to [facility] on 9/26.   Psychiatric history is significant for Depression, Anxiety, PTSD, and Polysubstance Abuse, and 1 Suicide Attempt (hanging- 10/2017), and 1 Prior Psychiatric Hospitalization [facility] 10/2017), and no history of Self Injurious Behavior.    Interval events: Patient documented to not be attending most group although did go to grief [facility] with appropriate engagement. Presenting with anxious/depressed affect, denying SI, HI and AVH. Reporting poor sleep yesterday, slept 7.75 hrs overnight.  Compliant with medications, no behavioral concerns reported   Interview: Today the patient reports she is alright. States she slept a little better. Still feels depressed but thinks the medications are helping somewhat. Notes that the stealing of her car and becoming homeless were huge triggers for relapse on crack cocaine - she had been sober for 3 years but has been using over the past few months. She remains motivated for finding an [facility] to help address this concern and wants to stay sober outpatient. She does think some  groups are helpful and is planning to go a bit today. No other concerns voiced. Endorses vague passive death thoughts but no SI, HI or AVH. Appetite is fine and sleep is better.     Past Medical History:  Past Medical History:  Diagnosis Date   Anxiety    Arthritis    Asthma    Depression    GERD (gastroesophageal reflux disease)    Hypertension    Neuromuscular disorder (HCC)    neuropathy    Past Surgical History:  Procedure Laterality Date   TONSILLECTOMY     TOOTH EXTRACTION N/A 06/14/2022   Procedure: DENTAL RESTORATION/EXTRACTIONS;  Surgeon: Sheryle Hamilton, DMD;  Location: [facility] OR;  Service: Oral Surgery;  Laterality: N/A;   tubes in ears     TYMPANOSTOMY TUBE PLACEMENT     Family History: History reviewed. No pertinent family history. Family Psychiatric  History:  Mother- Depression, Substance Abuse Paternal Uncle- Schizophrenia Maternal Uncle- Schizophrenia Brother- THC Abuse Multiple Members substance abuse No Known Suicides  Social History:  Social History   Substance and Sexual Activity  Alcohol Use No     Social History   Substance and Sexual Activity  Drug Use Yes   Types: Cocaine, Marijuana, Crack cocaine    Social History   Socioeconomic History   Marital status: Single    Spouse name: Not on file   Number of children: Not on file   Years of education: Not on file   Highest education level: Not on file  Occupational History   Not on file  Tobacco Use   Smoking status: Every Day    Current packs/day: 0.50    Types: Cigarettes   Smokeless tobacco:  Never  Vaping Use   Vaping status: Never Used  Substance and Sexual Activity   Alcohol use: No   Drug use: Yes    Types: Cocaine, Marijuana, Crack cocaine   Sexual activity: Yes    Birth control/protection: None  Other Topics Concern   Not on file  Social History Narrative   Not on file   Social Drivers of Health   Financial Resource Strain: Not on file  Food Insecurity: Food  Insecurity Present (11/28/2023)   Hunger Vital Sign    Worried About Running Out of Food in the Last Year: Often true    Ran Out of Food in the Last Year: Often true  Transportation Needs: Unmet Transportation Needs (11/28/2023)   PRAPARE - Administrator, Civil Service (Medical): Yes    Lack of Transportation (Non-Medical): Yes  Physical Activity: Not on file  Stress: Not on file  Social Connections: Not on file    Current Medications: Current Facility-Administered Medications  Medication Dose Route Frequency Provider Last Rate Last Admin   acetaminophen  (TYLENOL ) tablet 650 mg  650 mg Oral Q6H PRN Pashayan, Alexander S, DO   650 mg at 12/01/23 0815   albuterol  (VENTOLIN  HFA) 108 (90 Base) MCG/ACT inhaler 2 puff  2 puff Inhalation Q6H PRN Pashayan, Alexander S, DO       alum & mag hydroxide-simeth (MAALOX/MYLANTA) 200-200-20 MG/5ML suspension 15 mL  15 mL Oral Q6H PRN Pashayan, Alexander S, DO       amLODipine  (NORVASC ) tablet 5 mg  5 mg Oral Daily Pashayan, Alexander S, DO   5 mg at 12/01/23 0805   haloperidol  (HALDOL ) tablet 5 mg  5 mg Oral TID PRN Trudy Carwin, NP       And   diphenhydrAMINE  (BENADRYL ) capsule 50 mg  50 mg Oral TID PRN Trudy Carwin, NP       DULoxetine  (CYMBALTA ) DR capsule 30 mg  30 mg Oral Daily Pashayan, Alexander S, DO   30 mg at 12/01/23 9194   gabapentin  (NEURONTIN ) capsule 100 mg  100 mg Oral TID Pashayan, Alexander S, DO   100 mg at 12/01/23 1707   lisinopril  (ZESTRIL ) tablet 20 mg  20 mg Oral Daily Landy Veva CROME, RPH   20 mg at 12/01/23 9194   And   hydrochlorothiazide  (HYDRODIURIL ) tablet 12.5 mg  12.5 mg Oral Daily Landy Veva CROME, RPH   12.5 mg at 12/01/23 9193   hydrOXYzine  (ATARAX ) tablet 25 mg  25 mg Oral TID PRN Pashayan, Alexander S, DO   25 mg at 12/01/23 1707   loperamide  (IMODIUM ) capsule 2 mg  2 mg Oral PRN Pashayan, Alexander S, DO   2 mg at 12/01/23 2057   magnesium  hydroxide (MILK OF MAGNESIA) suspension 30 mL  30 mL Oral Daily PRN  Raliegh Marsa RAMAN, DO       metFORMIN  (GLUCOPHAGE ) tablet 1,000 mg  1,000 mg Oral BID WC Pashayan, Alexander S, DO   1,000 mg at 12/01/23 1707   methocarbamol  (ROBAXIN ) tablet 500 mg  500 mg Oral BID Pashayan, Alexander S, DO   500 mg at 12/01/23 2057   mirtazapine  (REMERON ) tablet 15 mg  15 mg Oral QHS Pashayan, Alexander S, DO   15 mg at 12/01/23 2057   nicotine  (NICODERM CQ  - dosed in mg/24 hours) patch 14 mg  14 mg Transdermal Daily Trudy Carwin, NP   14 mg at 12/01/23 0806   OLANZapine  (ZYPREXA ) injection 10 mg  10 mg Intramuscular TID  PRN Trudy Carwin, NP       OLANZapine  (ZYPREXA ) injection 5 mg  5 mg Intramuscular TID PRN Trudy Carwin, NP       traZODone  (DESYREL ) tablet 50 mg  50 mg Oral QHS PRN Pashayan, Alexander S, DO   50 mg at 12/01/23 2057    Lab Results:  No results found for this or any previous visit (from the past 48 hours).   Blood Alcohol level:  Lab Results  Component Value Date   [facility] <15 11/27/2023   ETH <10 10/31/2017    Metabolic Disorder Labs: Lab Results  Component Value Date   HGBA1C 5.3 11/29/2023   MPG 105.41 11/29/2023   No results found for: PROLACTIN Lab Results  Component Value Date   CHOL 192 11/29/2023   TRIG 224 (H) 11/29/2023   HDL 43 11/29/2023   CHOLHDL 4.5 11/29/2023   VLDL 45 (H) 11/29/2023   LDLCALC 104 (H) 11/29/2023   Physical findings;   Mental Status exam: Appearance: female of average BMI, seen laying on her side in bed under the covers, appears approximately stated age  Eye contact: fair  Attitude towards examiner cooperative, somewhat withdrawn  Psychomotor: no agitation, some mild retardation present  Speech: normal in prosody, quiet and low tone  Language: no delays  Mood: alright Affect: blunted, fatigued Thought content: denying SI (although endorsing vague passive death thoughts), denying HI, no delusions expressed  Thought Process: linear and organized  Perception: denying AVH, not RTIS  Insight:  good  Judgement: good   Orientation: x4 Attention/Concentration: good  Memory/Cognition: grossly intact on conversation   Fund of Knowledge: Average    Musculoskeletal: Strength & Muscle Tone: within normal limits Gait & Station: normal Patient leans: N/A  Physical Exam Vitals and nursing note reviewed.  Constitutional:      General: She is not in acute distress.    Appearance: Normal appearance. She is normal weight. She is not ill-appearing or toxic-appearing.  HENT:     Head: Normocephalic and atraumatic.  Pulmonary:     Effort: Pulmonary effort is normal.  Neurological:     General: No focal deficit present.     Mental Status: She is alert.    Review of Systems  Respiratory:  Negative for cough and shortness of breath.   Cardiovascular:  Negative for chest pain.  Gastrointestinal:  Negative for abdominal pain, constipation, diarrhea, nausea and vomiting.  Neurological:  Negative for dizziness, tremors, weakness and headaches.  Psychiatric/Behavioral:  Positive for depression and suicidal ideas (minimal). Negative for hallucinations. The patient is nervous/anxious.    Blood pressure 126/73, pulse 68, temperature 98.1 F (36.7 C), temperature source Oral, resp. rate 18, height 5' 5 (1.651 m), weight 67.1 kg, last menstrual period 12/17/2015, SpO2 98%. Body mass index is 24.63 kg/m.   Treatment Plan Summary: Daily contact with patient to assess and evaluate symptoms and progress in treatment and Medication management  Assessment: Rachel Knapp is a 53 yr old female who presented on 9/25 to [facility] with worsening depression and SI w/ a plan (cut wrists) in the context of recent relapse on cocaine and psychosocial stressors. After losing her car/housing she relapsed on crack cocaine with daily or near-daily usage, over-spending of resources and difficulty maintaining housing and employment related to use. In addition she  presents with severely low mood, anhedonia, SI with  plan, fatigue, poor concentration, and hopelessness  consistent with a major depressive episode. Currently, psychiatric history is most consistent with major depressive disorder  and severe stimulant use disorder (crack cocaine). She additionally carries diagnoses of GAD and PTSD. No history of mania or psychosis.    Yesterday she continued to report poor sleep and continued issues with depression and anxiety so evening Remeron  was increased to 15 mg. She was also endorsing ongoing SI that had somewhat improved. Today the patient remained blunted in affect, seen laying in bed with some psychomotor retardation, endorsing ongoing depression. Did report mild improvements in sleep and mood and did not voice overt SI today, although continued to express passive death wish. Will not make further changes to medications today but will consider increasing Cymbalta  tomorrow    DSM-5 diagnoses: MDD, Recurrent, Severe, w/out Psychosis Stimulant use disorder, severe, dependence (cocaine) GAD  PTSD:   Plan:  Legal Status: -Voluntary   Safety -q15 minute checks  -elopement, suicide and assault precautions  -daily vitals  Psychiatric Concerns  -Continue Cymbalta  30 mg daily for depression, anxiety, and neuropathy -Continue Remeron  15 mg QHS for depression, anxiety, sleep, and appetite -Continue Gabapentin  100 mg TID for anxiety and pain -Continue Agitation Protocol: Haldol /Benadryl /Zyprexa    Nicotine  Replacement  -Continue Nicotine  Patch 14 mg daily  Medical concerns HTN:  -Continue Amlodipine  5 mg daily -Continue Lisinopril -hydrochlorothiazide  20-12.5 mg daily     -Continue Robaxin  500 mg BID for chronic pain -Continue Albuterol  2 puffs q6 PRN -Continue Metformin  1000 mg BID -Continue PRN's: Tylenol , Maalox, Atarax , Milk of Magnesia, Trazodone   Labs --(CMP: WNL except  Tot Prot: 6.0,  CBC: WNL except WBC: 12.1,  Lymph Abs,  TSH: 1.136,  Urine Preg: Neg,  UDS: Cocaine, Oxy, THC positive,  A1c: 5.3,  Lipid Panel: WNL except  Trig: 224,  VLDL: 45,  LDL: 104, EKG: NSR w/ Qtc: 439)   Psychosocial interventions  -daily medication management with psychiatry -Medication education regarding risks/benefits and alternatives -bedside psychotherapy as indicated  -Patient will be encouraged to participate and engage with group therapy  -Appreciate SW assistance in coordinating safe disposition - patient voicing preference to [facility].     Leita LOISE Arts, MD 12/02/2023, 8:21 AM

## 2023-12-02 NOTE — Plan of Care (Signed)
   Problem: Education: Goal: Emotional status will improve Outcome: Progressing Goal: Mental status will improve Outcome: Progressing Goal: Verbalization of understanding the information provided will improve Outcome: Progressing   Problem: Activity: Goal: Interest or engagement in activities will improve Outcome: Progressing

## 2023-12-02 NOTE — Progress Notes (Signed)
(  Sleep Hours) - 7.75 (Any PRNs that were needed, meds refused, or side effects to meds)- PRN imodium  2 mg and trazodone  50 mg given at pt request, no meds refused.  (Any disturbances and when (visitation, over night)- None  (Concerns raised by the patient)- None  (SI/HI/AVH)- Denies SI/HI/AVH

## 2023-12-02 NOTE — Plan of Care (Signed)
   Problem: Education: Goal: Emotional status will improve Outcome: Not Progressing Goal: Mental status will improve Outcome: Not Progressing

## 2023-12-02 NOTE — Group Note (Signed)
 Date:  12/02/2023 Time:  9:13 AM  Group Topic/Focus:  Goals Group:   The focus of this group is to help patients establish daily goals to achieve during treatment and discuss how the patient can incorporate goal setting into their daily lives to aide in recovery.    Participation Level:  Did Not Attend   Robbyn Hodkinson R Kellen Dutch 12/02/2023, 9:13 AM

## 2023-12-03 ENCOUNTER — Encounter (HOSPITAL_COMMUNITY): Payer: Self-pay

## 2023-12-03 MED ORDER — CYCLOBENZAPRINE HCL 10 MG PO TABS
5.0000 mg | ORAL_TABLET | Freq: Three times a day (TID) | ORAL | Status: DC | PRN
Start: 2023-12-03 — End: 2023-12-05
  Administered 2023-12-03 – 2023-12-04 (×4): 5 mg via ORAL
  Filled 2023-12-03 (×5): qty 1

## 2023-12-03 NOTE — Group Note (Signed)
 Date:  12/03/2023 Time:  11:09 AM  Group Topic/Focus:  Goals Group:   The focus of this group is to help patients establish daily goals to achieve during treatment and discuss how the patient can incorporate goal setting into their daily lives to aide in recovery.    Participation Level:  Did Not Attend  Participation Quality:  Did Not Attend  Affect:  Did Not Attend  Cognitive:  Did Not Attend  Insight: None  Engagement in Group:  Did Not Attend  Modes of Intervention:  Did Not Attend  Additional Comments:  Did Not Attend  Avelina DELENA Humphreys 12/03/2023, 11:09 AM

## 2023-12-03 NOTE — Progress Notes (Signed)
(  Sleep Hours) - 7.25 (Any PRNs that were needed, meds refused, or side effects to meds)- PRN imodium  2 mg, trazodone  50 mg, and vistaril  given at pt request, no meds refused.  (Any disturbances and when (visitation, over night)- None  (Concerns raised by the patient)- None  (SI/HI/AVH)- Denies SI/HI/AVH

## 2023-12-03 NOTE — BH IP Treatment Plan (Signed)
 Interdisciplinary Treatment and Diagnostic Plan Update  12/03/2023 Time of Session: 12:00 PM - UPDATE Rachel Knapp MRN: [id number]  Principal Diagnosis: MDD (major depressive disorder), recurrent severe, without psychosis (HCC)  Secondary Diagnoses: Principal Problem:   MDD (major depressive disorder), recurrent severe, without psychosis (HCC) Active Problems:   GAD (generalized anxiety disorder)   PTSD (post-traumatic stress disorder)   Polysubstance abuse (HCC)   Current Medications:  Current Facility-Administered Medications  Medication Dose Route Frequency Provider Last Rate Last Admin   acetaminophen  (TYLENOL ) tablet 650 mg  650 mg Oral Q6H PRN Pashayan, Alexander S, DO   650 mg at 12/03/23 1513   albuterol  (VENTOLIN  HFA) 108 (90 Base) MCG/ACT inhaler 2 puff  2 puff Inhalation Q6H PRN Pashayan, Alexander S, DO       alum & mag hydroxide-simeth (MAALOX/MYLANTA) 200-200-20 MG/5ML suspension 15 mL  15 mL Oral Q6H PRN Pashayan, Alexander S, DO       amLODipine  (NORVASC ) tablet 5 mg  5 mg Oral Daily Pashayan, Alexander S, DO   5 mg at 12/03/23 0746   cyclobenzaprine  (FLEXERIL ) tablet 5 mg  5 mg Oral TID PRN Elodie Palma, MD   5 mg at 12/03/23 1358   haloperidol  (HALDOL ) tablet 5 mg  5 mg Oral TID PRN Trudy Carwin, NP       And   diphenhydrAMINE  (BENADRYL ) capsule 50 mg  50 mg Oral TID PRN Trudy Carwin, NP       DULoxetine  (CYMBALTA ) DR capsule 30 mg  30 mg Oral Daily Pashayan, Alexander S, DO   30 mg at 12/03/23 9253   gabapentin  (NEURONTIN ) capsule 100 mg  100 mg Oral TID Pashayan, Alexander S, DO   100 mg at 12/03/23 1209   lisinopril  (ZESTRIL ) tablet 20 mg  20 mg Oral Daily Landy Veva CROME, RPH   20 mg at 12/03/23 9253   And   hydrochlorothiazide  (HYDRODIURIL ) tablet 12.5 mg  12.5 mg Oral Daily Landy Veva CROME, RPH   12.5 mg at 12/03/23 0746   hydrOXYzine  (ATARAX ) tablet 25 mg  25 mg Oral TID PRN Pashayan, Alexander S, DO   25 mg at 12/03/23 1358   loperamide  (IMODIUM ) capsule  2 mg  2 mg Oral PRN Pashayan, Alexander S, DO   2 mg at 12/03/23 9251   magnesium  hydroxide (MILK OF MAGNESIA) suspension 30 mL  30 mL Oral Daily PRN Raliegh Marsa RAMAN, DO       metFORMIN  (GLUCOPHAGE ) tablet 1,000 mg  1,000 mg Oral BID WC Pashayan, Alexander S, DO   1,000 mg at 12/03/23 0746   methocarbamol  (ROBAXIN ) tablet 500 mg  500 mg Oral BID Pashayan, Alexander S, DO   500 mg at 12/03/23 0746   mirtazapine  (REMERON ) tablet 15 mg  15 mg Oral QHS Pashayan, Alexander S, DO   15 mg at 12/02/23 2057   nicotine  (NICODERM CQ  - dosed in mg/24 hours) patch 14 mg  14 mg Transdermal Daily Trudy Carwin, NP   14 mg at 12/03/23 0747   OLANZapine  (ZYPREXA ) injection 10 mg  10 mg Intramuscular TID PRN Trudy Carwin, NP       OLANZapine  (ZYPREXA ) injection 5 mg  5 mg Intramuscular TID PRN Trudy Carwin, NP       traZODone  (DESYREL ) tablet 50 mg  50 mg Oral QHS PRN Pashayan, Alexander S, DO   50 mg at 12/02/23 2057   PTA Medications: Medications Prior to Admission  Medication Sig Dispense Refill Last Dose/Taking   amLODipine  (NORVASC ) 5  MG tablet Take 5 mg by mouth daily.   Past Month   lisinopril -hydrochlorothiazide  (ZESTORETIC ) 20-12.5 MG tablet Take 1 tablet by mouth daily.   Past Month   metFORMIN  (GLUCOPHAGE ) 1000 MG tablet Take 1,000 mg by mouth 2 (two) times daily with a meal.   Past Month   methocarbamol  (ROBAXIN ) 500 MG tablet Take 500 mg by mouth 3 (three) times daily.   Past Month   VENTOLIN  HFA 108 (90 Base) MCG/ACT inhaler Inhale 2 puffs into the lungs every 6 (six) hours as needed for wheezing or shortness of breath.   Past Month   gabapentin  (NEURONTIN ) 400 MG capsule Take 1 capsule (400 mg total) by mouth 3 (three) times daily. For agitation 90 capsule 0     Patient Stressors: Financial difficulties   Health problems   Marital or family conflict   Substance abuse    Patient Strengths: Motivation for treatment/growth   Treatment Modalities: Medication Management, Group therapy,  Case management,  1 to 1 session with clinician, Psychoeducation, Recreational therapy.   Physician Treatment Plan for Primary Diagnosis: MDD (major depressive disorder), recurrent severe, without psychosis (HCC) Long Term Goal(s): Improvement in symptoms so as ready for discharge   Short Term Goals: Ability to identify changes in lifestyle to reduce recurrence of condition will improve  Medication Management: Evaluate patient's response, side effects, and tolerance of medication regimen.  Therapeutic Interventions: 1 to 1 sessions, Unit Group sessions and Medication administration.  Evaluation of Outcomes: Progressing  Physician Treatment Plan for Secondary Diagnosis: Principal Problem:   MDD (major depressive disorder), recurrent severe, without psychosis (HCC) Active Problems:   GAD (generalized anxiety disorder)   PTSD (post-traumatic stress disorder)   Polysubstance abuse (HCC)  Long Term Goal(s): Improvement in symptoms so as ready for discharge   Short Term Goals: Ability to identify changes in lifestyle to reduce recurrence of condition will improve     Medication Management: Evaluate patient's response, side effects, and tolerance of medication regimen.  Therapeutic Interventions: 1 to 1 sessions, Unit Group sessions and Medication administration.  Evaluation of Outcomes: Progressing   RN Treatment Plan for Primary Diagnosis: MDD (major depressive disorder), recurrent severe, without psychosis (HCC) Long Term Goal(s): Knowledge of disease and therapeutic regimen to maintain health will improve  Short Term Goals: Ability to remain free from injury will improve, Ability to verbalize frustration and anger appropriately will improve, Ability to verbalize feelings will improve, and Ability to disclose and discuss suicidal ideas  Medication Management: RN will administer medications as ordered by provider, will assess and evaluate patient's response and provide education to  patient for prescribed medication. RN will report any adverse and/or side effects to prescribing provider.  Therapeutic Interventions: 1 on 1 counseling sessions, Psychoeducation, Medication administration, Evaluate responses to treatment, Monitor vital signs and CBGs as ordered, Perform/monitor CIWA, COWS, AIMS and Fall Risk screenings as ordered, Perform wound care treatments as ordered.  Evaluation of Outcomes: Progressing   LCSW Treatment Plan for Primary Diagnosis: MDD (major depressive disorder), recurrent severe, without psychosis (HCC) Long Term Goal(s): Safe transition to appropriate next level of care at discharge, Engage patient in therapeutic group addressing interpersonal concerns.  Short Term Goals: Engage patient in aftercare planning with referrals and resources, Increase ability to appropriately verbalize feelings, Facilitate acceptance of mental health diagnosis and concerns, and Identify triggers associated with mental health/substance abuse issues  Therapeutic Interventions: Assess for all discharge needs, 1 to 1 time with Social worker, Explore available resources and support systems, Assess  for adequacy in community support network, Educate family and significant other(s) on suicide prevention, Complete Psychosocial Assessment, Interpersonal group therapy.  Evaluation of Outcomes: Progressing   Progress in Treatment: Attending groups: No. Participating in groups: No. Taking medication as prescribed: Yes. Toleration medication: Yes. Family/Significant other contact made: No, patient declined to provide consents at this time Patient understands diagnosis: Yes. Discussing patient identified problems/goals with staff: Yes. Medical problems stabilized or resolved: Yes. Denies suicidal/homicidal ideation: Yes. Issues/concerns per patient self-inventory: No.   New problem(s) identified:  No   New Short Term/Long Term Goal(s):     medication stabilization, elimination  of SI thoughts, development of comprehensive mental wellness plan.      Patient Goals:  I want to work on my depression and go to inpatient drug treatment.   Discharge Plan or Barriers:  Patient recently admitted. CSW will continue to follow and assess for appropriate referrals and possible discharge planning.    Reason for Continuation of Hospitalization: Depression Medication stabilization Suicidal ideation   Estimated Length of Stay:  4 - 6 days  Last 3 [place] Suicide Severity Risk Score: Flowsheet Row [facility] (Current) [facility] from [facility]27[facility]20[facility] in [facility] ED from 12/19/2022 in [facility] at [facility]  C-SSRS RISK CATEGORY High Risk High Risk No Risk    Last [facility] 2/9 Scores:     No data to display          Scribe for Treatment Team: Maximilian Tallo O Aitan Rossbach, LCSWA 12/03/2023 4:44 PM

## 2023-12-03 NOTE — Group Note (Signed)
 Date:  12/03/2023 Time:  10:21 PM  Group Topic/Focus:  AA/NA Group    Participation Level:  Active  Participation Quality:  Appropriate  Affect:  Appropriate  Cognitive:  Appropriate  Insight: Appropriate  Engagement in Group:  Engaged  Modes of Intervention:  Discussion  Additional Comments:  Patient attended AA/NA Group  Rachel Knapp 12/03/2023, 10:21 PM

## 2023-12-03 NOTE — Progress Notes (Signed)
 D:  Patient's self inventory sheet, patient has poor sleep, sleep medication not helpful.  Fair appetite, low energy level, good concentration.  Rated depression and hopeless 7, anxiety 10+.  Denied withdrawals.  Denied SI.  Has knot on R side of neck, painful.  BM twice today, 5:00 am and 30 minutes after breakfast.  Was given imodium , no BM after medication.  Goal is feel better.  Does have discharge plans. A:  Medications administered per MD orders.  Emotional support and encouragement given patient. R:  Denied SI and HI, contracts for safety.  Denied A/V hallucinations.

## 2023-12-03 NOTE — Progress Notes (Signed)
 [facility] MD Progress Note  12/03/2023 2:07 PM Rachel Knapp  MRN:  [id number] Subjective:    Rachel Knapp is a 53 yr old female who presented on 9/25 to [facility] with worsening depression and SI w/ a plan (cut wrists), she was admitted to [facility] on 9/26. Psychiatric history is significant for Depression, Anxiety, PTSD, and Polysubstance Abuse, and 1 Suicide Attempt (hanging- 10/2017), and 1 Prior Psychiatric Hospitalization [facility] 10/2017), and no history of Self Injurious Behavior.    Case was discussed in the multidisciplinary team. MAR was reviewed and patient is compliant with medications.  PRNs needed were loperamide , Tylenol , and trazodone .  On interview today patient states she feels irritated due to her diarrhea.  She states the diarrhea is affecting her sleep.  She describes her diarrhea as profuse watery, sudden, no blood, no mucus.  She states she has had her diarrhea for 6 months now.  She said prior to the onset of diarrhea she did not use antibiotics and was not hospitalized.  She states her diarrhea has sudden onset with no warning, with multiple times not able to make it to the bathroom on time.  she endorses increased anxiety.  She also endorses a lump in her neck she has had for 2 years has been bothering her due to muscle spasms and tightness.  She reports her appetite is doing fine.  She reports no SI, HI, or AVH.  She reports no paranoia or days of reference.  She reports no issues with her medications, stating that she feels they are helping her with her depression.     Principal Problem: MDD (major depressive disorder), recurrent severe, without psychosis (HCC) Diagnosis: Principal Problem:   MDD (major depressive disorder), recurrent severe, without psychosis (HCC) Active Problems:   GAD (generalized anxiety disorder)   PTSD (post-traumatic stress disorder)   Polysubstance abuse (HCC)  Total Time spent with patient: 20 minutes  Past Psychiatric History:  Previous psychiatric  diagnoses: MDD, anxiety, PTSD, polysubstance abuse Prior psychiatric treatment: Zoloft  Psychiatric medication compliance history: Noncompliant  Previous hospitalizations [facility]: 10/2017 History of suicide attempts: 10/2017: Hanging  History of self harm: Denies   Past Medical History:  Past Medical History:  Diagnosis Date   Anxiety    Arthritis    Asthma    Depression    GERD (gastroesophageal reflux disease)    Hypertension    Neuromuscular disorder (HCC)    neuropathy    Past Surgical History:  Procedure Laterality Date   TONSILLECTOMY     TOOTH EXTRACTION N/A 06/14/2022   Procedure: DENTAL RESTORATION/EXTRACTIONS;  Surgeon: Sheryle Hamilton, DMD;  Location: [facility] OR;  Service: Oral Surgery;  Laterality: N/A;   tubes in ears     TYMPANOSTOMY TUBE PLACEMENT     Family History: History reviewed. No pertinent family history. Family Psychiatric  History:  Mother- Depression, Substance Abuse Paternal Uncle- Schizophrenia Maternal Uncle- Schizophrenia Brother- THC Abuse Multiple Members substance abuse No Known Suicides Social History:  Social History   Substance and Sexual Activity  Alcohol Use No     Social History   Substance and Sexual Activity  Drug Use Yes   Types: Cocaine, Marijuana, Crack cocaine    Social History   Socioeconomic History   Marital status: Single    Spouse name: Not on file   Number of children: Not on file   Years of education: Not on file   Highest education level: Not on file  Occupational History   Not on file  Tobacco Use   Smoking status: Every Day    Current packs/day: 0.50    Types: Cigarettes   Smokeless tobacco: Never  Vaping Use   Vaping status: Never Used  Substance and Sexual Activity   Alcohol use: No   Drug use: Yes    Types: Cocaine, Marijuana, Crack cocaine   Sexual activity: Yes    Birth control/protection: None  Other Topics Concern   Not on file  Social History Narrative   Not on file   Social Drivers of  Health   Financial Resource Strain: Not on file  Food Insecurity: Food Insecurity Present (11/28/2023)   Hunger Vital Sign    Worried About Running Out of Food in the Last Year: Often true    Ran Out of Food in the Last Year: Often true  Transportation Needs: Unmet Transportation Needs (11/28/2023)   PRAPARE - Administrator, Civil Service (Medical): Yes    Lack of Transportation (Non-Medical): Yes  Physical Activity: Not on file  Stress: Not on file  Social Connections: Not on file   Additional Social History:                         Sleep: Poor Estimated Sleeping Duration (Last 24 Hours): 6.50-6.75 hours  Appetite:  Good  Current Medications: Current Facility-Administered Medications  Medication Dose Route Frequency Provider Last Rate Last Admin   acetaminophen  (TYLENOL ) tablet 650 mg  650 mg Oral Q6H PRN Pashayan, Alexander S, DO   650 mg at 12/02/23 1617   albuterol  (VENTOLIN  HFA) 108 (90 Base) MCG/ACT inhaler 2 puff  2 puff Inhalation Q6H PRN Pashayan, Alexander S, DO       alum & mag hydroxide-simeth (MAALOX/MYLANTA) 200-200-20 MG/5ML suspension 15 mL  15 mL Oral Q6H PRN Pashayan, Alexander S, DO       amLODipine  (NORVASC ) tablet 5 mg  5 mg Oral Daily Pashayan, Alexander S, DO   5 mg at 12/03/23 9253   cyclobenzaprine  (FLEXERIL ) tablet 5 mg  5 mg Oral TID PRN Elodie Palma, MD   5 mg at 12/03/23 1358   haloperidol  (HALDOL ) tablet 5 mg  5 mg Oral TID PRN Trudy Carwin, NP       And   diphenhydrAMINE  (BENADRYL ) capsule 50 mg  50 mg Oral TID PRN Trudy Carwin, NP       DULoxetine  (CYMBALTA ) DR capsule 30 mg  30 mg Oral Daily Pashayan, Alexander S, DO   30 mg at 12/03/23 0746   gabapentin  (NEURONTIN ) capsule 100 mg  100 mg Oral TID Pashayan, Alexander S, DO   100 mg at 12/03/23 1209   lisinopril  (ZESTRIL ) tablet 20 mg  20 mg Oral Daily Landy Veva CROME, RPH   20 mg at 12/03/23 9253   And   hydrochlorothiazide  (HYDRODIURIL ) tablet 12.5 mg  12.5 mg Oral Daily  Landy Veva CROME, RPH   12.5 mg at 12/03/23 0746   hydrOXYzine  (ATARAX ) tablet 25 mg  25 mg Oral TID PRN Pashayan, Alexander S, DO   25 mg at 12/03/23 1358   loperamide  (IMODIUM ) capsule 2 mg  2 mg Oral PRN Pashayan, Alexander S, DO   2 mg at 12/03/23 9251   magnesium  hydroxide (MILK OF MAGNESIA) suspension 30 mL  30 mL Oral Daily PRN Raliegh Marsa RAMAN, DO       metFORMIN  (GLUCOPHAGE ) tablet 1,000 mg  1,000 mg Oral BID WC Pashayan, Alexander S, DO   1,000 mg at 12/03/23  9253   methocarbamol  (ROBAXIN ) tablet 500 mg  500 mg Oral BID Pashayan, Alexander S, DO   500 mg at 12/03/23 9253   mirtazapine  (REMERON ) tablet 15 mg  15 mg Oral QHS Pashayan, Alexander S, DO   15 mg at 12/02/23 2057   nicotine  (NICODERM CQ  - dosed in mg/24 hours) patch 14 mg  14 mg Transdermal Daily Trudy Carwin, NP   14 mg at 12/03/23 9252   OLANZapine  (ZYPREXA ) injection 10 mg  10 mg Intramuscular TID PRN Trudy Carwin, NP       OLANZapine  (ZYPREXA ) injection 5 mg  5 mg Intramuscular TID PRN Trudy Carwin, NP       traZODone  (DESYREL ) tablet 50 mg  50 mg Oral QHS PRN Pashayan, Alexander S, DO   50 mg at 12/02/23 2057    Lab Results: No results found for this or any previous visit (from the past 48 hours).  Blood Alcohol level:  Lab Results  Component Value Date   [facility] <15 11/27/2023   ETH <10 10/31/2017    Metabolic Disorder Labs: Lab Results  Component Value Date   HGBA1C 5.3 11/29/2023   MPG 105.41 11/29/2023   No results found for: PROLACTIN Lab Results  Component Value Date   CHOL 192 11/29/2023   TRIG 224 (H) 11/29/2023   HDL 43 11/29/2023   CHOLHDL 4.5 11/29/2023   VLDL 45 (H) 11/29/2023   LDLCALC 104 (H) 11/29/2023    Physical Findings: AIMS:  ,  ,  ,  ,  ,  ,   CIWA:    COWS:     Musculoskeletal: Strength & Muscle Tone: within normal limits Gait & Station: normal Patient leans: N/A  Psychiatric Specialty Exam:  Presentation  General Appearance:  Appropriate for Environment;  Casual  Eye Contact: Good  Speech: Clear and Coherent  Speech Volume: Normal  Handedness: Right   Mood and Affect  Mood: Irritable  Affect: Congruent   Thought Process  Thought Processes: Coherent  Descriptions of Associations:Intact  Orientation:Full (Time, Place and Person)  Thought Content:Logical  History of Schizophrenia/Schizoaffective disorder:No  Duration of Psychotic Symptoms:No data recorded Hallucinations:Hallucinations: None  Ideas of Reference:None  Suicidal Thoughts:Suicidal Thoughts: No  Homicidal Thoughts:Homicidal Thoughts: No   Sensorium  Memory: Immediate Good; Recent Good  Judgment: Fair  Insight: Fair   Art therapist  Concentration: Good  Attention Span: Good  Recall: Good  Fund of Knowledge: Good  Language: Good   Psychomotor Activity  Psychomotor Activity:Psychomotor Activity: Normal   Assets  Assets: Desire for Improvement; Communication Skills   Sleep  Sleep:Sleep: Poor    Physical Exam: Physical Exam Constitutional:      General: She is not in acute distress.    Appearance: Normal appearance. She is normal weight. She is not ill-appearing or toxic-appearing.  HENT:     Head: Normocephalic and atraumatic.  Pulmonary:     Effort: Pulmonary effort is normal.    Review of Systems  Gastrointestinal:  Positive for abdominal pain and diarrhea. Negative for constipation, nausea and vomiting.  Neurological:  Positive for headaches. Negative for dizziness.   Blood pressure 93/67, pulse 62, temperature 97.9 F (36.6 C), temperature source Oral, resp. rate 14, height 5' 5 (1.651 m), weight 67.1 kg, last menstrual period 12/17/2015, SpO2 97%. Body mass index is 24.63 kg/m.   Assessment/Plan:  Rachel Knapp is a 53 year old female with a psychiatric history of depression, anxiety, PTSD, polysubstance abuse, and one prior suicide attempt by hanging (2019), admitted for  worsening depression  and suicidal ideation with a plan to cut her wrists. She has a prior psychiatric hospitalization at [facility] in 2019 and no history of self-injurious behavior. On interview today, she denied current SI, HI, psychosis, or paranoia, and reported that her medications have been helpful for her depression. However, she describes persistent diarrhea for the past 6 months, which she reports has worsened her sleep and heightened her anxiety, along with concern for a longstanding neck lump associated with muscle spasms and tightness. We will plan to order stool studies to assess the diarrhea and start flexeril  for the neck spasms. Will not make further changes to medications today but will consider increasing Cymbalta  tomorrow.  MDD, recurrent, severe, without psychosis/ GAD/ PTSD/ stimulant use disorder, severe dependence (cocaine) - Continue Cymbalta  30 mg daily for depression, anxiety, and neuropathy - Continue Remeron  15 mg QHS for depression, anxiety, sleep, and appetite - Continue Gabapentin  100 mg TID for anxiety and pain - Continue Agitation Protocol: Haldol /Benadryl /Zyprexa  - Trazodone  50 mg at bedtime as needed for insomnia - Atarax  25 mg TID as needed for anxiety  Watery, profuse diarrhea - Order stool studies: fecal fat, qualitative, IgA/G+ tTG-IgA, hepatitis panel (to rule out hepatitis A) lactoferrin qualitative, pancreatic elastase fecal  Hypertension/other medical concerns - Continue amlodipine  5 mg daily - Continue lisinopril -hydrochlorothiazide  20-12.5 mg daily - Start Flexeril  5 mg 3 times daily as needed for neck spasms/pain - Continue Robaxin  500 mg twice daily for chronic pain - Continue albuterol  2 puffs every 6 hours as needed -Continue metformin  1000 mg twice daily  Nicotine  withdrawal - Patient in need of nicotine  replacement; nicotine  patch 14 mg / 24 hours ordered. Smoking cessation encouraged  Safety and Monitoring: - Voluntary admission to inpatient psychiatric unit for  safety, stabilization and treatment - Daily contact with patient to assess and evaluate symptoms and progress in treatment - Patient's case to be discussed in multi-disciplinary team meeting - Observation Level : q15 minute checks - Vital signs:  q12 hours - Precautions: suicide, elopement, and assault  Other as needed medications  Tylenol  650 mg every 6 hours as needed for pain Mylanta 30 mL every 4 hours as needed for indigestion Milk of magnesia 30 mL daily as needed for constipation   The risks/benefits/side-effects/alternatives to the above medication were discussed in detail with the patient and time was given for questions. The patient consents to medication trial. FDA black box warnings, if present, were discussed.  The patient is agreeable with the medication plan, as above. We will monitor the patient's response to pharmacologic treatment, and adjust medications as necessary.  6.   Group Therapy: - Encouraged patient to participate in unit milieu and in scheduled group therapies  - Short Term Goals: Ability to identify changes in lifestyle to reduce recurrence of condition will improve - Long Term Goals: Improvement in symptoms so as ready for discharge - Patient is encouraged to participate in group therapy while admitted to the psychiatric unit. - We will address other chronic and acute stressors, which contributed to the patient's MDD (major depressive disorder), recurrent severe, without psychosis (HCC) in order to reduce the risk of self-harm at discharge.  7.   Discharge Planning:  - Social work and case management to assist with discharge planning and identification of hospital follow-up needs prior to discharge - Estimated LOS: 5-7 days  - Discharge Concerns: Need to establish a safety plan; Medication compliance and effectiveness - Discharge Goals: Return home with outpatient referrals for mental health  follow-up including medication management/psychotherapy  I  certify that inpatient services furnished can reasonably be expected to improve the patient's condition.     Alan Maiden, MD 12/03/2023, 2:07 PM

## 2023-12-03 NOTE — Group Note (Signed)
 Date:  12/03/2023 Time:  3:37 PM  Group Topic/Focus: Sleep Hygiene Dimensions of Wellness:   The focus of this group is to introduce the topic of wellness and discuss the role each dimension of wellness plays in total health.    Participation Level:  Did Not Attend  Rachel Knapp 12/03/2023, 3:37 PM

## 2023-12-03 NOTE — Group Note (Unsigned)
 Date:  12/03/2023 Time:  10:28 AM  Group Topic/Focus:  Goals Group:   The focus of this group is to help patients establish daily goals to achieve during treatment and discuss how the patient can incorporate goal setting into their daily lives to aide in recovery.     Participation Level:  {[facility] PARTICIPATION OZCZO:77735}  Participation Quality:  {[facility] PARTICIPATION QUALITY:22265}  Affect:  {[facility] AFFECT:22266}  Cognitive:  {[facility] COGNITIVE:22267}  Insight: {[facility] Insight2:20797}  Engagement in Group:  {[facility] ENGAGEMENT IN HMNLE:77731}  Modes of Intervention:  {[facility] MODES OF INTERVENTION:22269}  Additional Comments:  ***  Rachel Knapp 12/03/2023, 10:28 AM

## 2023-12-03 NOTE — Progress Notes (Signed)

## 2023-12-03 NOTE — Plan of Care (Signed)
 Nurse discussed anxiety, depression and coping skills with patient.

## 2023-12-03 NOTE — Group Note (Signed)
 Recreation Therapy Group Note   Group Topic:Other  Group Date: 12/03/2023 Start Time: 0930 End Time: 1000 Facilitators: Delainey Winstanley-McCall, LRT,CTRS Location: [address] Dayroom   Group Topic: Communication, Problem Solving   Goal Area(s) Addresses:  Patient will effectively listen to complete activity.  Patient will identify communication skills used to make activity successful.  Patient will identify how skills used during activity can be used to reach post d/c goals.    Intervention: Building surveyor Activity - Geometric pattern cards, pencils, blank paper    Activity: Geometric Drawings.  Three volunteers from the peer group will be shown an abstract picture with a particular arrangement of geometrical shapes.  Each round, one 'speaker' will describe the pattern, as accurately as possible without revealing the image to the group.  The remaining group members will listen and draw the picture to reflect how it is described to them. Patients with the role of 'listener' cannot ask clarifying questions but, may request that the speaker repeat a direction. Once the drawings are complete, the presenter will show the rest of the group the picture and compare how close each person came to drawing the picture. LRT will facilitate a post-activity discussion regarding effective communication and the importance of planning, listening, and asking for clarification in daily interactions with others.  Education: Environmental consultant, Active listening, Support systems, Discharge planning  Education Outcome: Acknowledges understanding/In group clarification offered/Needs additional education.    Affect/Mood: N/A   Participation Level: N/A    Clinical Observations/Individualized Feedback: Recreation therapy group session did not occur due to previous going time.     Plan: Continue to engage patient in RT group sessions 2-3x/week.   Travell Desaulniers-McCall, LRT,CTRS 12/03/2023 1:21 PM

## 2023-12-04 DIAGNOSIS — R197 Diarrhea, unspecified: Secondary | ICD-10-CM

## 2023-12-04 DIAGNOSIS — F17203 Nicotine dependence unspecified, with withdrawal: Secondary | ICD-10-CM

## 2023-12-04 DIAGNOSIS — F1420 Cocaine dependence, uncomplicated: Secondary | ICD-10-CM

## 2023-12-04 LAB — HEPATITIS PANEL, ACUTE
HCV Ab: NONREACTIVE
Hep A IgM: NONREACTIVE
Hep B C IgM: NONREACTIVE
Hepatitis B Surface Ag: NONREACTIVE

## 2023-12-04 MED ORDER — IBUPROFEN 400 MG PO TABS
400.0000 mg | ORAL_TABLET | Freq: Four times a day (QID) | ORAL | Status: DC | PRN
  Administered 2023-12-04: 400 mg via ORAL
  Filled 2023-12-04: qty 1

## 2023-12-04 NOTE — Group Note (Signed)
 Date:  12/04/2023 Time:  2:56 PM  Group Topic/Focus:  Emotional Education: The session centered around the Circle of Control activity, which helps participants identify what aspects of their life they can control versus what is outside their control. This exercise encourages emotional awareness, personal empowerment, and stress management. Participants were guided to reflect on situations in their life and categorize them into three areas: Things I can control, Things I can influence, and Things I cannot control. The group was encouraged to reflect on the impact these distinctions have on their emotional well-being.  Participation Level:  Did Not Attend   Rachel Knapp 12/04/2023, 2:56 PM

## 2023-12-04 NOTE — Plan of Care (Signed)

## 2023-12-04 NOTE — Group Note (Signed)
 LCSW Group Therapy Note   Group Date: 12/04/2023 Start Time: 1100 End Time: 1200   Participation:  did not attend  Type of Therapy:  Group Therapy  Topic:  Speaking from the Heart:  Communicating with Understanding and Empathy  Objective:  To help participants develop effective communication skills to express themselves clearly, listen actively, and navigate conflicts in a healthy way.  Goals: Increase awareness of verbal and non-verbal communication skills. Practice using "I" statements and active listening techniques. Learn coping strategies for managing communication stress.  Summary:  Participants explored the importance of communication, discussed challenges, and practiced skills such as active listening and assertive expression. They reflected on past experiences and identified ways to improve communication in their daily lives.  Therapeutic Modalities: Cognitive-Behavioral Therapy (CBT): Restructuring negative thought patterns in communication. Mindfulness: Staying present and calm during conversations. Psychoeducation: Learning about effective communication techniques.   Skylarr Liz O Chesnee Floren, LCSWA 12/04/2023  12:45 PM

## 2023-12-04 NOTE — Progress Notes (Signed)
 Stool collected and given to [facility] lab tech

## 2023-12-04 NOTE — Progress Notes (Signed)
 D. Pt presents anxious, but friendly, reported sleeping well last night, described her appetite as 'fair', energy level as 'low', and concentration as 'good'. Per pt's self inventory, pt rated her depression,hopelessness and anxiety a 2/2/8, respectively. Pt continues to complain of 9/10 neck pain-requesting flexaril this am for relief. Pt has been visible in the milieu, observed interacting well with peers and attending group. SABRA Pt currently denies SI/HI and AVH and doesn't appear to be responding to internal stimuli. A. Labs and vitals monitored. Pt given and educated on medications. Pt supported emotionally and encouraged to express concerns and ask questions.   R. Pt remains safe with 15 minute checks. Will continue POC.

## 2023-12-04 NOTE — Progress Notes (Signed)
 [facility] MD Progress Note  12/04/2023 1:22 PM Rachel Knapp  MRN:  [id number] Subjective:    Rachel Knapp is a 53 yr old female who presented on 9/25 to [facility] with worsening depression and SI w/ a plan (cut wrists), she was admitted to [facility] on 9/26. Psychiatric history is significant for Depression, Anxiety, PTSD, and Polysubstance Abuse, and 1 Suicide Attempt (hanging- 10/2017), and 1 Prior Psychiatric Hospitalization [facility] 10/2017), and no history of Self Injurious Behavior.    Case was discussed in the multidisciplinary team. MAR was reviewed and patient is compliant with medications.  PRNs needed were hydroxyzine , Tylenol , and trazodone .  On interview today patient states she was unable to give stool samples for the stool test due to taking Imodium  and has not had a bowel movement since yesterday at 5 AM.  Patient confirmed she has been having watery loose stools daily for the past 2 to 3 months.  She continues to have pain in her neck stating the Flexeril  did not help.  Patient states her mood is fine and that she has an upcoming interview with [facility] [place] [facility].  Patient states despite taking her trazodone  200 mg it took her 4 hours to fall asleep.  Patient requested for discharge tomorrow due to her interview.  She denies any problems with medications, denies SI, HI, AVH, ideas of reference, or paranoia.    Principal Problem: MDD (major depressive disorder), recurrent severe, without psychosis (HCC) Diagnosis: Principal Problem:   MDD (major depressive disorder), recurrent severe, without psychosis (HCC) Active Problems:   GAD (generalized anxiety disorder)   PTSD (post-traumatic stress disorder)   Polysubstance abuse (HCC)  Total Time spent with patient: 20 minutes  Past Psychiatric History:  Previous psychiatric diagnoses: MDD, anxiety, PTSD, polysubstance abuse Prior psychiatric treatment: Zoloft  Psychiatric medication compliance history: Noncompliant  Previous hospitalizations [facility]:  10/2017 History of suicide attempts: 10/2017: Hanging  History of self harm: Denies   Past Medical History:  Past Medical History:  Diagnosis Date   Anxiety    Arthritis    Asthma    Depression    GERD (gastroesophageal reflux disease)    Hypertension    Neuromuscular disorder (HCC)    neuropathy    Past Surgical History:  Procedure Laterality Date   TONSILLECTOMY     TOOTH EXTRACTION N/A 06/14/2022   Procedure: DENTAL RESTORATION/EXTRACTIONS;  Surgeon: Sheryle Hamilton, DMD;  Location: [facility] OR;  Service: Oral Surgery;  Laterality: N/A;   tubes in ears     TYMPANOSTOMY TUBE PLACEMENT     Family History: History reviewed. No pertinent family history. Family Psychiatric  History:  Mother- Depression, Substance Abuse Paternal Uncle- Schizophrenia Maternal Uncle- Schizophrenia Brother- THC Abuse Multiple Members substance abuse No Known Suicides Social History:  Social History   Substance and Sexual Activity  Alcohol Use No     Social History   Substance and Sexual Activity  Drug Use Yes   Types: Cocaine, Marijuana, Crack cocaine    Social History   Socioeconomic History   Marital status: Single    Spouse name: Not on file   Number of children: Not on file   Years of education: Not on file   Highest education level: Not on file  Occupational History   Not on file  Tobacco Use   Smoking status: Every Day    Current packs/day: 0.50    Types: Cigarettes   Smokeless tobacco: Never  Vaping Use   Vaping status: Never Used  Substance and Sexual  Activity   Alcohol use: No   Drug use: Yes    Types: Cocaine, Marijuana, Crack cocaine   Sexual activity: Yes    Birth control/protection: None  Other Topics Concern   Not on file  Social History Narrative   Not on file   Social Drivers of Health   Financial Resource Strain: Not on file  Food Insecurity: Food Insecurity Present (11/28/2023)   Hunger Vital Sign    Worried About Running Out of Food in the Last Year:  Often true    Ran Out of Food in the Last Year: Often true  Transportation Needs: Unmet Transportation Needs (11/28/2023)   PRAPARE - Administrator, Civil Service (Medical): Yes    Lack of Transportation (Non-Medical): Yes  Physical Activity: Not on file  Stress: Not on file  Social Connections: Not on file   Additional Social History:                         Sleep: Poor Estimated Sleeping Duration (Last 24 Hours): 5.75-7.25 hours  Appetite:  Good  Current Medications: Current Facility-Administered Medications  Medication Dose Route Frequency Provider Last Rate Last Admin   acetaminophen  (TYLENOL ) tablet 650 mg  650 mg Oral Q6H PRN Pashayan, Alexander S, DO   650 mg at 12/03/23 1513   albuterol  (VENTOLIN  HFA) 108 (90 Base) MCG/ACT inhaler 2 puff  2 puff Inhalation Q6H PRN Pashayan, Alexander S, DO       alum & mag hydroxide-simeth (MAALOX/MYLANTA) 200-200-20 MG/5ML suspension 15 mL  15 mL Oral Q6H PRN Pashayan, Alexander S, DO       amLODipine  (NORVASC ) tablet 5 mg  5 mg Oral Daily Pashayan, Alexander S, DO   5 mg at 12/04/23 0800   cyclobenzaprine  (FLEXERIL ) tablet 5 mg  5 mg Oral TID PRN Elodie Palma, MD   5 mg at 12/04/23 0759   haloperidol  (HALDOL ) tablet 5 mg  5 mg Oral TID PRN Trudy Carwin, NP       And   diphenhydrAMINE  (BENADRYL ) capsule 50 mg  50 mg Oral TID PRN Trudy Carwin, NP       DULoxetine  (CYMBALTA ) DR capsule 30 mg  30 mg Oral Daily Pashayan, Alexander S, DO   30 mg at 12/04/23 0800   gabapentin  (NEURONTIN ) capsule 100 mg  100 mg Oral TID Pashayan, Alexander S, DO   100 mg at 12/04/23 1208   lisinopril  (ZESTRIL ) tablet 20 mg  20 mg Oral Daily Landy Veva CROME, RPH   20 mg at 12/04/23 0800   And   hydrochlorothiazide  (HYDRODIURIL ) tablet 12.5 mg  12.5 mg Oral Daily Landy Veva CROME, RPH   12.5 mg at 12/04/23 0800   hydrOXYzine  (ATARAX ) tablet 25 mg  25 mg Oral TID PRN Pashayan, Alexander S, DO   25 mg at 12/03/23 2124   loperamide  (IMODIUM )  capsule 2 mg  2 mg Oral PRN Pashayan, Alexander S, DO   2 mg at 12/03/23 9251   magnesium  hydroxide (MILK OF MAGNESIA) suspension 30 mL  30 mL Oral Daily PRN Raliegh Marsa RAMAN, DO       metFORMIN  (GLUCOPHAGE ) tablet 1,000 mg  1,000 mg Oral BID WC Pashayan, Alexander S, DO   1,000 mg at 12/04/23 0800   methocarbamol  (ROBAXIN ) tablet 500 mg  500 mg Oral BID Pashayan, Alexander S, DO   500 mg at 12/04/23 0800   mirtazapine  (REMERON ) tablet 15 mg  15 mg Oral QHS Pashayan,  Marsa RAMAN, DO   15 mg at 12/03/23 2123   nicotine  (NICODERM CQ  - dosed in mg/24 hours) patch 14 mg  14 mg Transdermal Daily Trudy Carwin, NP   14 mg at 12/04/23 9245   OLANZapine  (ZYPREXA ) injection 10 mg  10 mg Intramuscular TID PRN Trudy Carwin, NP       OLANZapine  (ZYPREXA ) injection 5 mg  5 mg Intramuscular TID PRN Trudy Carwin, NP       traZODone  (DESYREL ) tablet 50 mg  50 mg Oral QHS PRN Raliegh Marsa RAMAN, DO   50 mg at 12/03/23 2122    Lab Results:  Results for orders placed or performed during the hospital encounter of 11/28/23 (from the past 48 hours)  Hepatitis panel, acute     Status: None   Collection Time: 12/04/23  6:21 AM  Result Value Ref Range   Hepatitis B Surface Ag NON REACTIVE NON REACTIVE   HCV Ab NON REACTIVE NON REACTIVE    Comment: (NOTE) Nonreactive HCV antibody screen is consistent with no HCV infections,  unless recent infection is suspected or other evidence exists to indicate HCV infection.     Hep A IgM NON REACTIVE NON REACTIVE   Hep B C IgM NON REACTIVE NON REACTIVE    Comment: Performed at [facility], [address], [place], [place] [address]    Blood Alcohol level:  Lab Results  Component Value Date   [facility] <15 11/27/2023   ETH <10 10/31/2017    Metabolic Disorder Labs: Lab Results  Component Value Date   HGBA1C 5.3 11/29/2023   MPG 105.41 11/29/2023   No results found for: PROLACTIN Lab Results  Component Value Date   CHOL 192 11/29/2023   TRIG 224  (H) 11/29/2023   HDL 43 11/29/2023   CHOLHDL 4.5 11/29/2023   VLDL 45 (H) 11/29/2023   LDLCALC 104 (H) 11/29/2023    Physical Findings: AIMS:  ,  ,  ,  ,  ,  ,   CIWA:    COWS:     Musculoskeletal: Strength & Muscle Tone: within normal limits Gait & Station: normal Patient leans: N/A  Psychiatric Specialty Exam:  Presentation  General Appearance:  Appropriate for Environment; Casual  Eye Contact: Good  Speech: Clear and Coherent  Speech Volume: Normal  Handedness: Right   Mood and Affect  Mood: Irritable  Affect: Congruent   Thought Process  Thought Processes: Coherent  Descriptions of Associations:Intact  Orientation:Full (Time, Place and Person)  Thought Content:Logical  History of Schizophrenia/Schizoaffective disorder:No  Duration of Psychotic Symptoms:No data recorded Hallucinations:Hallucinations: None  Ideas of Reference:None  Suicidal Thoughts:Suicidal Thoughts: No  Homicidal Thoughts:Homicidal Thoughts: No   Sensorium  Memory: Immediate Good; Recent Good  Judgment: Fair  Insight: Fair   Art therapist  Concentration: Good  Attention Span: Good  Recall: Good  Fund of Knowledge: Good  Language: Good   Psychomotor Activity  Psychomotor Activity:Psychomotor Activity: Normal   Assets  Assets: Desire for Improvement; Communication Skills   Sleep  Sleep:Sleep: Poor    Physical Exam: Physical Exam Constitutional:      General: She is not in acute distress.    Appearance: Normal appearance. She is normal weight. She is not ill-appearing or toxic-appearing.  HENT:     Head: Normocephalic and atraumatic.  Pulmonary:     Effort: Pulmonary effort is normal.    Review of Systems  Gastrointestinal:  Positive for abdominal pain. Negative for nausea and vomiting.  Neurological:  Positive  for headaches. Negative for dizziness.   Blood pressure 105/71, pulse 67, temperature 97.6 F (36.4 C),  temperature source Oral, resp. rate 16, height 5' 5 (1.651 m), weight 67.1 kg, last menstrual period 12/17/2015, SpO2 100%. Body mass index is 24.63 kg/m.   Assessment/Plan:  Rachel Knapp is a 53 year old female with a psychiatric history of depression, anxiety, PTSD, polysubstance abuse, and one prior suicide attempt by hanging (2019), admitted for worsening depression and suicidal ideation with a plan to cut her wrists. She has a prior psychiatric hospitalization at [facility] in 2019 and no history of self-injurious behavior.  Concerns are still largely related to physical issues including neck pain and longstanding diarrhea.  Her history of the diarrhea is unreliable, yesterday stating she had diarrhea for 6 months today clarifying she had diarrhea for 2 to 3 months.  We will attempt to get a stool sample, we will plan for discharge tomorrow as patient has interview with [facility].  MDD, recurrent, severe, without psychosis/ GAD/ PTSD/ stimulant use disorder, severe dependence (cocaine) - Continue Cymbalta  30 mg daily for depression, anxiety, and neuropathy - Continue Remeron  15 mg QHS for depression, anxiety, sleep, and appetite - Continue Gabapentin  100 mg TID for anxiety and pain - Continue Agitation Protocol: Haldol /Benadryl /Zyprexa  - Trazodone  50 mg at bedtime as needed for insomnia - Atarax  25 mg TID as needed for anxiety  Watery, profuse diarrhea - Pending stool studies: fecal fat, qualitative, IgA/G+ tTG-IgA, hepatitis panel (to rule out hepatitis A) lactoferrin qualitative, pancreatic elastase fecal  Hypertension/other medical concerns - Continue amlodipine  5 mg daily - Continue lisinopril -hydrochlorothiazide  20-12.5 mg daily - Continue Flexeril  5 mg 3 times daily as needed for neck spasms/pain - Continue Robaxin  500 mg twice daily for chronic pain - Continue albuterol  2 puffs every 6 hours as needed -Continue metformin  1000 mg twice daily  Nicotine  withdrawal - Patient in  need of nicotine  replacement; nicotine  patch 14 mg / 24 hours ordered. Smoking cessation encouraged  Safety and Monitoring: - Voluntary admission to inpatient psychiatric unit for safety, stabilization and treatment - Daily contact with patient to assess and evaluate symptoms and progress in treatment - Patient's case to be discussed in multi-disciplinary team meeting - Observation Level : q15 minute checks - Vital signs:  q12 hours - Precautions: suicide, elopement, and assault  Other as needed medications  Tylenol  650 mg every 6 hours as needed for pain Mylanta 30 mL every 4 hours as needed for indigestion Milk of magnesia 30 mL daily as needed for constipation   The risks/benefits/side-effects/alternatives to the above medication were discussed in detail with the patient and time was given for questions. The patient consents to medication trial. FDA black box warnings, if present, were discussed.  The patient is agreeable with the medication plan, as above. We will monitor the patient's response to pharmacologic treatment, and adjust medications as necessary.  6.   Group Therapy: - Encouraged patient to participate in unit milieu and in scheduled group therapies  - Short Term Goals: Ability to identify changes in lifestyle to reduce recurrence of condition will improve - Long Term Goals: Improvement in symptoms so as ready for discharge - Patient is encouraged to participate in group therapy while admitted to the psychiatric unit. - We will address other chronic and acute stressors, which contributed to the patient's MDD (major depressive disorder), recurrent severe, without psychosis (HCC) in order to reduce the risk of self-harm at discharge.  7.   Discharge Planning:  - Social  work and case management to assist with discharge planning and identification of hospital follow-up needs prior to discharge - Estimated LOS: Disposition 10/3 - Discharge Concerns: Need to establish a safety  plan; Medication compliance and effectiveness - Discharge Goals: Return home with outpatient referrals for mental health follow-up including medication management/psychotherapy  I certify that inpatient services furnished can reasonably be expected to improve the patient's condition.     Alan Maiden, MD 12/04/2023, 1:22 PM

## 2023-12-04 NOTE — Plan of Care (Signed)
?  Problem: Coping: ?Goal: Ability to verbalize frustrations and anger appropriately will improve ?Outcome: Progressing ?Goal: Ability to demonstrate self-control will improve ?Outcome: Progressing ?  ?Problem: Health Behavior/Discharge Planning: ?Goal: Compliance with treatment plan for underlying cause of condition will improve ?Outcome: Progressing ?  ?

## 2023-12-04 NOTE — Progress Notes (Addendum)
(  Sleep Hours) - 7 hours (Any PRNs that were needed, meds refused, or side effects to meds)- flexeril , trazodone , atarax  (Any disturbances and when (visitation, over night) (Concerns raised by the patient)- none (SI/HI/AVH)- denies

## 2023-12-04 NOTE — Group Note (Signed)
 Date:  12/04/2023 Time:  4:26 PM  Group Topic/Focus:  Overcoming Stress:   The focus of this group is to define stress and help patients assess their triggers.    Participation Level:  Did Not Attend    Annalee Larch 12/04/2023, 4:26 PM

## 2023-12-04 NOTE — Group Note (Signed)
 Date:  12/04/2023 Time:  9:55 PM  Group Topic/Focus:  Wrap-Up Group:   The focus of this group is to help patients review their daily goal of treatment and discuss progress on daily workbooks.    Participation Level:  Active  Participation Quality:  Appropriate and Attentive  Affect:  Appropriate  Cognitive:  Alert and Appropriate  Insight: Appropriate and Good  Engagement in Group:  Engaged  Modes of Intervention:  Discussion and Education  Additional Comments:  Pt attended and participated in wrap up group this evening and rated their day a 7/10. Pt stated that they have felt anxiety continuously and that the meds aren't working to help relieve their anxiety. Pt is anticipating D/C 10/3, and states that they will be ready for D/C once their anxiety had been addressed. Pt has concerns about the reasoning surrounding their recent blood draws. Pt has no other concerns to relay at this time.   Rachel Knapp 12/04/2023, 9:55 PM

## 2023-12-04 NOTE — Group Note (Signed)
 Date:  12/04/2023 Time:  9:46 AM  Group Topic/Focus:  Goals Group:   The focus of this group is to help patients establish daily goals to achieve during treatment and discuss how the patient can incorporate goal setting into their daily lives to aide in recovery.    Participation Level:  Did Not Attend   Rachel Knapp 12/04/2023, 9:46 AM

## 2023-12-05 LAB — LACTOFERRIN, FECAL, QUALITATIVE: Lactoferrin, Fecal, Qual: NEGATIVE

## 2023-12-05 MED ORDER — HYDROCHLOROTHIAZIDE 12.5 MG PO TABS: 12.5000 mg | ORAL_TABLET | Freq: Every day | ORAL | 0 refills | Status: AC

## 2023-12-05 MED ORDER — GABAPENTIN 100 MG PO CAPS: 100.0000 mg | ORAL_CAPSULE | Freq: Three times a day (TID) | ORAL | 0 refills | Status: AC

## 2023-12-05 MED ORDER — LISINOPRIL 20 MG PO TABS: 20.0000 mg | ORAL_TABLET | Freq: Every day | ORAL | 0 refills | Status: AC

## 2023-12-05 MED ORDER — NICOTINE 14 MG/24HR TD PT24: 14.0000 mg | MEDICATED_PATCH | Freq: Every day | TRANSDERMAL | 0 refills | Status: AC

## 2023-12-05 MED ORDER — CYCLOBENZAPRINE HCL 5 MG PO TABS: 5.0000 mg | ORAL_TABLET | Freq: Three times a day (TID) | ORAL | 0 refills | Status: AC | PRN

## 2023-12-05 MED ORDER — MIRTAZAPINE 15 MG PO TABS: 15.0000 mg | ORAL_TABLET | Freq: Every day | ORAL | 0 refills | Status: AC

## 2023-12-05 MED ORDER — METHOCARBAMOL 500 MG PO TABS: 500.0000 mg | ORAL_TABLET | Freq: Two times a day (BID) | ORAL | 0 refills | Status: AC

## 2023-12-05 MED ORDER — DULOXETINE HCL 30 MG PO CPEP: 30.0000 mg | ORAL_CAPSULE | Freq: Every day | ORAL | 0 refills | Status: AC

## 2023-12-05 MED ORDER — TRAZODONE HCL 50 MG PO TABS: 50.0000 mg | ORAL_TABLET | Freq: Every evening | ORAL | 0 refills | Status: AC | PRN

## 2023-12-05 NOTE — Progress Notes (Signed)
  [facility] Adult Case Management Discharge Plan :  Will you be returning to the same living situation after discharge:  No. Patient will be discharging to [facility]. At discharge, do you have transportation home?: No. CSW provided taxi voucher through [employer] taxi pick up time at 10:30 am.  Do you have the ability to pay for your medications: Yes,  patient has active health insurance and received paper scripts.  Release of information consent forms completed and in the chart;  Patient's signature needed at discharge.  Patient to Follow up at:  Follow-up Information     [facility]. Go on 12/10/2023.   Specialty: Behavioral Health Why: Please go to this provider on 12/10/23 at 7:00 am for an assessment, to obtain medication management services.  You may also go Monday through Friday, arrive by 7:00 am for your assessment. Contact information: [address] [place]  [phone]        [facility]. Go on 12/09/2023.   Specialty: Professional Counselor Why: Please go to this provider on 12/09/23 at 9:00 am for an assessment, to obtain therapy services. You may also go Monday through Friday, from 9 am to 1 pm. Contact information: [address] [place] [address] [phone]         [facility] Follow up.   Specialty: Addiction Medicine Why: Referral made Contact information: [address] [place] [address] [phone]                 Next level of care provider has access to [facility] Link:no  Safety Planning and Suicide Prevention discussed: Yes,  completed with patient.  Suicide Prevention Education was reviewed thoroughly with patient, including risk factors, warning signs, and what to do. Mobile Crisis services were described and that telephone number pointed out, with encouragement to patient to put this number in  personal cell phone. Brochure was provided to patient to share with natural supports. Patient acknowledged the ways in which they are at risk, and how working through each of their issues can gradually start to reduce their risk factors. Patient was encouraged to think of the information in the context of people in their own lives. Patient denied having access to firearms Patient verbalized understanding of information provided. Patient endorsed a desire to live.      Has patient been referred to the Quitline?: Patient refused referral for treatment  Patient has been referred for addiction treatment: Patient refused referral for treatment.  Louetta Lame, LCSWA 12/05/2023, 9:11 AM
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
## 2023-12-05 NOTE — Plan of Care (Signed)
  Problem: Education: Goal: Knowledge of [place] [facility] Education information/materials will improve Outcome: Progressing Goal: Emotional status will improve Outcome: Progressing Goal: Mental status will improve Outcome: Progressing Goal: Verbalization of understanding the information provided will improve Outcome: Progressing   Problem: Education: Goal: Knowledge of [place] [facility] Education information/materials will improve Outcome: Progressing Goal: Emotional status will improve Outcome: Progressing Goal: Mental status will improve Outcome: Progressing Goal: Verbalization of understanding the information provided will improve Outcome: Progressing   Problem: Education: Goal: Knowledge of [place] [facility] Education information/materials will improve Outcome: Progressing Goal: Emotional status will improve Outcome: Progressing Goal: Mental status will improve Outcome: Progressing Goal: Verbalization of understanding the information provided will improve Outcome: Progressing   Problem: Education: Goal: Knowledge of [place] [facility] Education information/materials will improve Outcome: Progressing Goal: Emotional status will improve Outcome: Progressing Goal: Mental status will improve Outcome: Progressing Goal: Verbalization of understanding the information provided will improve Outcome: Progressing

## 2023-12-05 NOTE — BH Assessment (Signed)
(  Sleep Hours) - 7.5 (Any PRNs that were needed, meds refused, or side effects to meds)-  (Any disturbances and when (visitation, over night)- None (Concerns raised by the patient)- Pt C/O of loose stools. Pt advised she ate cereal milk and grapes for snack 2100 and breakfast Pt had a very large breakfast.  (SI/HI/AVH)- Denies

## 2023-12-05 NOTE — Group Note (Signed)
 Date:  12/05/2023 Time:  9:31 AM  Group Topic/Focus:  Goals Group:   The focus of this group is to help patients establish daily goals to achieve during treatment and discuss how the patient can incorporate goal setting into their daily lives to aide in recovery.    Participation Level:  Did Not Attend  Participation Quality:  N/A  Affect:  N/A  Cognitive:  N/A  Insight: None  Engagement in Group:  None  Modes of Intervention:  N/A  Additional Comments:  Patient did not attend goals group.  Kristi HERO Gracin Mcpartland 12/05/2023, 9:31 AM

## 2023-12-05 NOTE — Transportation (Signed)
 12/05/2023  Rachel Knapp DOB: Aug 29, 1970 MRN: [id number]   RIDER WAIVER AND RELEASE OF LIABILITY  For the purposes of helping with transportation needs, [facility] partners with outside transportation providers (taxi companies, [place], Catering manager.) to give [facility] patients or other approved people the choice of on-demand rides Public librarian) to our buildings for non-emergency visits.  By using [employer], I, the person signing this document, on behalf of myself and/or any legal minors (in my care using the [employer]), agree:  Science writer given to me are supplied by independent, outside transportation providers who do not work for, or have any affiliation with, [employer]. [place] is not a transportation company. [facility] has no control over the quality or safety of the rides I get using [employer]. [facility] has no control over whether any outside ride will happen on time or not. [facility] gives no guarantee on the reliability, quality, safety, or availability on any rides, or that no mistakes will happen. I know and accept that traveling by vehicle (car, truck, SVU, fleeta, bus, taxi, etc.) has risks of serious injuries such as disability, being paralyzed, and death. I know and agree the risk of using [employer] is mine alone, and not [employer]. [employer] are provided as is and as are available. The transportation providers are in charge for all inspections and care of the vehicles used to provide these rides. I agree not to take legal action against [place], its agents, employees, officers, directors, representatives, insurers, attorneys, assigns, successors, subsidiaries, and affiliates at any time for any reasons related directly or indirectly to using [employer]. I also agree not to take legal action against [place] or its affiliates for any injury, death, or damage to property caused by or related to using  [employer]. I have read this Waiver and Release of Liability, and I understand the terms used in it and their legal meaning. This Waiver is freely and voluntarily given with the understanding that my right (or any legal minors) to legal action against [place] relating to [employer] is knowingly given up to use these services.   I attest that I read the Ride Waiver and Release of Liability to [name], gave [name] the opportunity to ask questions and answered the questions asked (if any). I affirm that [name] then provided consent for assistance with transportation.

## 2023-12-05 NOTE — Plan of Care (Signed)
   Problem: Activity: Goal: Interest or engagement in activities will improve Outcome: Progressing Goal: Sleeping patterns will improve Outcome: Progressing   Problem: Coping: Goal: Ability to verbalize frustrations and anger appropriately will improve Outcome: Progressing Goal: Ability to demonstrate self-control will improve Outcome: Progressing   Problem: Health Behavior/Discharge Planning: Goal: Identification of resources available to assist in meeting health care needs will improve Outcome: Progressing Goal: Compliance with treatment plan for underlying cause of condition will improve Outcome: Progressing   Problem: Safety: Goal: Periods of time without injury will increase Outcome: Progressing

## 2023-12-05 NOTE — [facility] Suicide Risk Assessment (Signed)
 [facility] INPATIENT:  Family/Significant Other Suicide Prevention Education  Suicide Prevention Education:  Patient Refusal for Family/Significant Other Suicide Prevention Education: The patient Rachel Knapp has refused to provide written consent for family/significant other to be provided Family/Significant Other Suicide Prevention Education during admission and/or prior to discharge.  Physician notified.  Suicide Prevention Education was reviewed thoroughly with patient, including risk factors, warning signs, and what to do. Mobile Crisis services were described and that telephone number pointed out, with encouragement to patient to put this number in personal cell phone. Brochure was provided to patient to share with [facility]. Patient acknowledged the ways in which they are at risk, and how working through each of their issues can gradually start to reduce their risk factors. Patient was encouraged to think of the information in the context of people in their own lives. Patient denied having access to firearms Patient verbalized understanding of information provided. Patient endorsed a desire to live.    Louetta Lame 12/05/2023, 9:14 AM

## 2023-12-05 NOTE — [facility] Suicide Risk Assessment (Signed)
 Suicide Risk Assessment  Discharge Assessment    [facility] Discharge Suicide Risk Assessment   Principal Problem: MDD (major depressive disorder), recurrent severe, without psychosis (HCC) Discharge Diagnoses: Principal Problem:   MDD (major depressive disorder), recurrent severe, without psychosis (HCC) Active Problems:   GAD (generalized anxiety disorder)   PTSD (post-traumatic stress disorder)   Polysubstance abuse (HCC)   Total Time spent with patient: 20 minutes  Hospital Course:   During the patient's hospitalization, patient had extensive initial psychiatric evaluation, and follow-up psychiatric evaluations every day.   Psychiatric diagnoses provided upon initial assessment: MDD, Recurrent, Severe, w/out Psychosis  GAD  PTSD    Patient's psychiatric medications were adjusted on admission: Started Cymbalta  20 mg and Remeron  7.5 mg for mood and anxiety, and started nicotine  patch 14 mg daily   During the hospitalization, other adjustments were made to the patient's psychiatric medication regimen: Increase cymbalta  to 30 mg, started gabapentin  100 mg for anxiety and pain   Patient's care was discussed during the interdisciplinary team meeting every day during the hospitalization.   The patient denied having side effects to prescribed psychiatric medication.   Gradually, patient started adjusting to milieu. The patient was evaluated each day by a clinical provider to ascertain response to treatment. Improvement was noted by the patient's report of decreasing symptoms, improved sleep and appetite, affect, medication tolerance, behavior, and participation in unit programming.  Patient was asked each day to complete a self inventory noting mood, mental status, pain, new symptoms, anxiety and concerns.   Symptoms were reported as significantly decreased or resolved completely by discharge.  The patient reports that their mood is stable.  The patient denied having suicidal thoughts for  more than 48 hours prior to discharge.  Patient denies having homicidal thoughts.  Patient denies having auditory hallucinations.  Patient denies any visual hallucinations or other symptoms of psychosis.  The patient was motivated to continue taking medication with a goal of continued improvement in mental health.    The patient reports their target psychiatric symptoms of depression and SI responded well to the psychiatric medications, and the patient reports overall benefit other psychiatric hospitalization. Supportive psychotherapy was provided to the patient. The patient also participated in regular group therapy while hospitalized. Coping skills, problem solving as well as relaxation therapies were also part of the unit programming.   Labs were reviewed with the patient, and abnormal results were discussed with the patient.   The patient is able to verbalize their individual safety plan to this provider.   # It is recommended to the patient to continue psychiatric medications as prescribed, after discharge from the hospital.     # It is recommended to the patient to follow up with your outpatient psychiatric provider and PCP.   # It was discussed with the patient, the impact of alcohol, drugs, tobacco have been there overall psychiatric and medical wellbeing, and total abstinence from substance use was recommended the patient.ed.   # Prescriptions provided or sent directly to preferred pharmacy at discharge. Patient agreeable to plan. Given opportunity to ask questions. Appears to feel comfortable with discharge.    # In the event of worsening symptoms, the patient is instructed to call the crisis hotline, 911 and or go to the nearest ED for appropriate evaluation and treatment of symptoms. To follow-up with primary care provider for other medical issues, concerns and or health care needs   # Patient was discharged with son with plans to go to [facility] with  a plan to follow up as noted  below.       On day of discharge patient reports patient reports she is feeling a little anxious, but contributes this to leaving and her upcoming appointment with the [facility].  She states she slept good last night.  She reports her appetite is doing good.  She reports her mood is the best has been that she has been here, and that she is hopeful that the medication will help her.  She states she was able to give the stool sample to the nurses as she finally had her diarrhea returned.  She reports no SI, HI, or AVH.  She reports no paranoia or ideas of reference.  She reports no issues with her medications.  Musculoskeletal: Strength & Muscle Tone: within normal limits Gait & Station: normal Patient leans: N/A  Psychiatric Specialty Exam  Presentation  General Appearance:  Appropriate for Environment  Eye Contact: Good  Speech: Clear and Coherent  Speech Volume: Normal  Handedness: Right   Mood and Affect  Mood: Euthymic  Duration of Depression Symptoms: Greater than two weeks  Affect: Other (comment) (Irritated)   Thought Process  Thought Processes: Coherent  Descriptions of Associations:Intact  Orientation:Full (Time, Place and Person)  Thought Content:Logical  History of Schizophrenia/Schizoaffective disorder:No  Duration of Psychotic Symptoms:No data recorded Hallucinations:Hallucinations: None  Ideas of Reference:None  Suicidal Thoughts:Suicidal Thoughts: No  Homicidal Thoughts:Homicidal Thoughts: No   Sensorium  Memory: Immediate Good; Recent Good  Judgment: Fair  Insight: Fair   Art therapist  Concentration: Good  Attention Span: Good  Recall: Good  Fund of Knowledge: Good  Language: Good   Psychomotor Activity  Psychomotor Activity: Psychomotor Activity: Normal   Assets  Assets: Desire for Improvement; Communication Skills   Sleep  Sleep: Sleep: Fair  Estimated Sleeping Duration (Last 24 Hours):  6.75-7.50 hours  Physical Exam: Physical Exam Constitutional:      General: She is not in acute distress.    Appearance: Normal appearance. She is not ill-appearing, toxic-appearing or diaphoretic.  HENT:     Head: Normocephalic and atraumatic.  Pulmonary:     Effort: Pulmonary effort is normal.  Neurological:     Mental Status: She is alert.    Review of Systems  Gastrointestinal:  Positive for diarrhea. Negative for abdominal pain, constipation, nausea and vomiting.  Neurological:  Negative for dizziness and headaches.  Psychiatric/Behavioral:  Negative for hallucinations and suicidal ideas. The patient is nervous/anxious.    Blood pressure 110/76, pulse 89, temperature 97.6 F (36.4 C), temperature source Oral, resp. rate 12, height 5' 5 (1.651 m), weight 67.1 kg, last menstrual period 12/17/2015, SpO2 100%. Body mass index is 24.63 kg/m.  Mental Status Per Nursing Assessment::   On Admission:  NA  Demographic Factors:  Unemployed  Loss Factors: Decline in physical health  Historical Factors: Family history of mental illness or substance abuse  Risk Reduction Factors:   Sense of responsibility to family and Positive social support  Continued Clinical Symptoms:  Severe Anxiety and/or Agitation Depression:   Hopelessness  Cognitive Features That Contribute To Risk:  None    Suicide Risk:  Minimal: No identifiable suicidal ideation.  Patients presenting with no risk factors but with morbid ruminations; may be classified as minimal risk based on the severity of the depressive symptoms   Follow-up Information     [facility]. Go on 12/10/2023.   Specialty: Behavioral Health Why: Please go to this provider on 12/10/23  at 7:00 am for an assessment, to obtain medication management services.  You may also go Monday through Friday, arrive by 7:00 am for your assessment. Contact information: [address] [place]   [phone]        [facility]. Go on 12/09/2023.   Specialty: Professional Counselor Why: Please go to this provider on 12/09/23 at 9:00 am for an assessment, to obtain therapy services. You may also go Monday through Friday, from 9 am to 1 pm. Contact information: [address] [place]  [address] [place] [id number] [phone]         [facility] Follow up.   Specialty: Addiction Medicine Why: Referral made Contact information: [address] [place] [address] [phone]                 Plan Of Care/Follow-up recommendations:  Activity: as tolerated   Diet: heart healthy   Other: -Follow-up with your outpatient psychiatric provider -instructions on appointment date, time, and address (location) are provided to you in discharge paperwork.   -Take your psychiatric medications as prescribed at discharge - instructions are provided to you in the discharge paperwork   -Follow-up with outpatient primary care doctor and other specialists -for management of chronic medical disease, including: HTN, DM2, Chronic diarrhea    -Testing: Follow-up with outpatient provider for abnormal lab results: Lipid panel high triglycerides   -Recommend abstinence from alcohol, tobacco, and other illicit drug use at discharge.    -If your psychiatric symptoms recur, worsen, or if you have side effects to your psychiatric medications, call your outpatient psychiatric provider, 911, [phone] or go to the nearest emergency department.   -If suicidal thoughts recur, call your outpatient psychiatric provider, 911, [phone] or go to the nearest emergency department.    Alan Maiden, MD 12/05/2023, 8:25 AM

## 2023-12-05 NOTE — Progress Notes (Signed)
 Patient discharged to home via taxi. Discharge instructions, prescriptions, all required discharge documents and information about follow-up appointment given to pt with verbalization of understanding. All personal belongings returned to pt at time of discharge. Pt escorted to lobby by RN at 1104.  12/05/23 0900  Psych Admission Type (Psych Patients Only)  Admission Status Voluntary  Psychosocial Assessment  Patient Complaints None  Eye Contact Fair  Facial Expression Anxious  Affect Appropriate to circumstance  Speech Logical/coherent  Interaction Assertive  Motor Activity Other (Comment) (wnl)  Appearance/Hygiene Unremarkable  Behavior Characteristics Cooperative  Mood Anxious;Pleasant  Thought Process  Coherency WDL  Content WDL  Delusions None reported or observed  Perception WDL  Hallucination None reported or observed  Judgment Impaired  Confusion None  Danger to Self  Current suicidal ideation? Denies  Agreement Not to Harm Self Yes  Description of Agreement Verbal  Danger to Others  Danger to Others None reported or observed

## 2023-12-05 NOTE — Group Note (Signed)
 Recreation Therapy Group Note   Group Topic:Team Building  Group Date: 12/05/2023 Start Time: 0935 End Time: 1013 Facilitators: Blayn Whetsell-McCall, LRT,CTRS Location: [address] Dayroom   Group Topic: Communication, Team Building, Problem Solving  Goal Area(s) Addresses:  Patient will effectively work with peer towards shared goal.  Patient will identify skills used to make activity successful.  Patient will identify how skills used during activity can be used to reach post d/c goals.   Behavioral Response: Engaged  Intervention: STEM Activity  Activity: Straw Bridge. In teams of 3-5, patients were given 15 plastic drinking straws and an equal length of masking tape. Using the materials provided, patients were instructed to build a free standing bridge-like structure to suspend an everyday item (ex: puzzle box) off of the floor or table surface. All materials were required to be used by the team in their design. LRT facilitated post-activity discussion reviewing team process. Patients were encouraged to reflect how the skills used in this activity can be generalized to daily life post discharge.   Education: Pharmacist, community, Scientist, physiological, Discharge Planning   Education Outcome: Acknowledges education/In group clarification offered/Needs additional education.    Affect/Mood: Appropriate   Participation Level: Engaged   Participation Quality: Independent   Behavior: Appropriate   Speech/Thought Process: Focused   Insight: Good   Judgement: Good   Modes of Intervention: STEM Activity   Patient Response to Interventions:  Engaged   Education Outcome:  In group clarification offered    Clinical Observations/Individualized Feedback: Pt assisted in putting her groups bridge together. Pt was attentive and focused throughout.     Plan: Continue to engage patient in RT group sessions 2-3x/week.   Zephaniah Lubrano-McCall, LRT,CTRS  12/05/2023 11:41 AM

## 2023-12-06 LAB — GLIA (IGA/G) + TTG IGA
Antigliadin Abs, IgA: 3 U (ref 0–19)
Gliadin IgG: 3 U (ref 0–19)
Tissue Transglutaminase Ab, IgA: <2 U/mL (ref 0–3)

## 2023-12-07 NOTE — Progress Notes (Signed)

## 2023-12-08 LAB — FECAL FAT, QUALITATIVE
Fat Qual Neutral, Stl: NORMAL
Fat Qual Total, Stl: NORMAL

## 2023-12-08 LAB — PANCREATIC ELASTASE, FECAL: Pancreatic Elastase-1, Stool: 8 ug Elast./g — ABNORMAL LOW (ref >200)

## 2023-12-09 LAB — OVA + PARASITE EXAM

## 2023-12-09 LAB — O&P RESULT
